# Patient Record
Sex: Female | Born: 1950
Health system: Southern US, Community
[De-identification: ages and names within clinical notes are randomized; demographics above are authoritative.]

## PROBLEM LIST (undated history)

## (undated) DIAGNOSIS — F329 Major depressive disorder, single episode, unspecified: Secondary | ICD-10-CM

## (undated) DIAGNOSIS — N200 Calculus of kidney: Secondary | ICD-10-CM

## (undated) DIAGNOSIS — G4730 Sleep apnea, unspecified: Secondary | ICD-10-CM

## (undated) DIAGNOSIS — I671 Cerebral aneurysm, nonruptured: Secondary | ICD-10-CM

## (undated) DIAGNOSIS — E7800 Pure hypercholesterolemia, unspecified: Secondary | ICD-10-CM

## (undated) DIAGNOSIS — I10 Essential (primary) hypertension: Secondary | ICD-10-CM

## (undated) DIAGNOSIS — F32A Depression, unspecified: Secondary | ICD-10-CM

## (undated) DIAGNOSIS — R42 Dizziness and giddiness: Secondary | ICD-10-CM

## (undated) DIAGNOSIS — K219 Gastro-esophageal reflux disease without esophagitis: Secondary | ICD-10-CM

## (undated) DIAGNOSIS — F419 Anxiety disorder, unspecified: Secondary | ICD-10-CM

## (undated) DIAGNOSIS — H269 Unspecified cataract: Secondary | ICD-10-CM

## (undated) DIAGNOSIS — C801 Malignant (primary) neoplasm, unspecified: Secondary | ICD-10-CM

## (undated) HISTORY — PX: COLONOSCOPY: SHX174

## (undated) HISTORY — DX: Anxiety disorder, unspecified: F41.9

## (undated) HISTORY — PX: ABDOMINAL HYSTERECTOMY: SHX81

## (undated) HISTORY — PX: TUBAL LIGATION: SHX77

## (undated) HISTORY — DX: Gastro-esophageal reflux disease without esophagitis: K21.9

## (undated) HISTORY — PX: EYE SURGERY: SHX253

---
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
## 2000-01-16 ENCOUNTER — Emergency Department (HOSPITAL_COMMUNITY): Admission: EM | Admit: 2000-01-16 | Discharge: 2000-01-16 | Payer: Self-pay | Admitting: *Deleted

## 2012-03-06 DIAGNOSIS — K219 Gastro-esophageal reflux disease without esophagitis: Secondary | ICD-10-CM | POA: Insufficient documentation

## 2012-09-04 DIAGNOSIS — K760 Fatty (change of) liver, not elsewhere classified: Secondary | ICD-10-CM | POA: Insufficient documentation

## 2013-05-21 DIAGNOSIS — G4730 Sleep apnea, unspecified: Secondary | ICD-10-CM

## 2013-05-21 HISTORY — DX: Sleep apnea, unspecified: G47.30

## 2013-07-20 DIAGNOSIS — M545 Low back pain, unspecified: Secondary | ICD-10-CM | POA: Insufficient documentation

## 2013-11-05 DIAGNOSIS — H3589 Other specified retinal disorders: Secondary | ICD-10-CM | POA: Insufficient documentation

## 2013-11-05 DIAGNOSIS — H35059 Retinal neovascularization, unspecified, unspecified eye: Secondary | ICD-10-CM | POA: Insufficient documentation

## 2013-11-05 DIAGNOSIS — H3560 Retinal hemorrhage, unspecified eye: Secondary | ICD-10-CM | POA: Insufficient documentation

## 2013-11-05 DIAGNOSIS — H4420 Degenerative myopia, unspecified eye: Secondary | ICD-10-CM | POA: Insufficient documentation

## 2013-11-05 DIAGNOSIS — H353190 Nonexudative age-related macular degeneration, unspecified eye, stage unspecified: Secondary | ICD-10-CM | POA: Insufficient documentation

## 2014-05-21 DIAGNOSIS — H269 Unspecified cataract: Secondary | ICD-10-CM

## 2014-05-21 HISTORY — DX: Unspecified cataract: H26.9

## 2014-09-30 DIAGNOSIS — L821 Other seborrheic keratosis: Secondary | ICD-10-CM | POA: Insufficient documentation

## 2014-11-09 DIAGNOSIS — M8580 Other specified disorders of bone density and structure, unspecified site: Secondary | ICD-10-CM | POA: Insufficient documentation

## 2014-11-09 DIAGNOSIS — N200 Calculus of kidney: Secondary | ICD-10-CM | POA: Insufficient documentation

## 2014-11-17 DIAGNOSIS — Z9849 Cataract extraction status, unspecified eye: Secondary | ICD-10-CM | POA: Insufficient documentation

## 2015-02-11 DIAGNOSIS — F419 Anxiety disorder, unspecified: Secondary | ICD-10-CM | POA: Insufficient documentation

## 2015-02-11 DIAGNOSIS — E559 Vitamin D deficiency, unspecified: Secondary | ICD-10-CM | POA: Insufficient documentation

## 2015-02-11 DIAGNOSIS — E782 Mixed hyperlipidemia: Secondary | ICD-10-CM | POA: Insufficient documentation

## 2015-08-06 DIAGNOSIS — R05 Cough: Secondary | ICD-10-CM | POA: Diagnosis not present

## 2015-08-14 DIAGNOSIS — J209 Acute bronchitis, unspecified: Secondary | ICD-10-CM | POA: Diagnosis not present

## 2015-08-14 DIAGNOSIS — J040 Acute laryngitis: Secondary | ICD-10-CM | POA: Diagnosis not present

## 2015-09-05 DIAGNOSIS — G4733 Obstructive sleep apnea (adult) (pediatric): Secondary | ICD-10-CM | POA: Diagnosis not present

## 2015-09-06 DIAGNOSIS — G4733 Obstructive sleep apnea (adult) (pediatric): Secondary | ICD-10-CM | POA: Diagnosis not present

## 2015-09-06 DIAGNOSIS — Z9989 Dependence on other enabling machines and devices: Secondary | ICD-10-CM | POA: Diagnosis not present

## 2015-09-06 DIAGNOSIS — R5383 Other fatigue: Secondary | ICD-10-CM | POA: Diagnosis not present

## 2015-10-14 DIAGNOSIS — R0789 Other chest pain: Secondary | ICD-10-CM | POA: Diagnosis not present

## 2015-12-28 DIAGNOSIS — K219 Gastro-esophageal reflux disease without esophagitis: Secondary | ICD-10-CM | POA: Diagnosis not present

## 2015-12-28 DIAGNOSIS — R0689 Other abnormalities of breathing: Secondary | ICD-10-CM | POA: Diagnosis not present

## 2015-12-28 DIAGNOSIS — F419 Anxiety disorder, unspecified: Secondary | ICD-10-CM | POA: Diagnosis not present

## 2016-01-19 DIAGNOSIS — I10 Essential (primary) hypertension: Secondary | ICD-10-CM | POA: Diagnosis not present

## 2016-02-24 DIAGNOSIS — L259 Unspecified contact dermatitis, unspecified cause: Secondary | ICD-10-CM | POA: Diagnosis not present

## 2016-06-30 DIAGNOSIS — R42 Dizziness and giddiness: Secondary | ICD-10-CM | POA: Diagnosis not present

## 2016-06-30 DIAGNOSIS — H6692 Otitis media, unspecified, left ear: Secondary | ICD-10-CM | POA: Diagnosis not present

## 2016-07-04 DIAGNOSIS — B029 Zoster without complications: Secondary | ICD-10-CM | POA: Diagnosis not present

## 2016-07-04 DIAGNOSIS — R1032 Left lower quadrant pain: Secondary | ICD-10-CM | POA: Diagnosis not present

## 2016-07-19 DIAGNOSIS — H43812 Vitreous degeneration, left eye: Secondary | ICD-10-CM | POA: Diagnosis not present

## 2016-07-19 DIAGNOSIS — H32 Chorioretinal disorders in diseases classified elsewhere: Secondary | ICD-10-CM | POA: Diagnosis not present

## 2016-07-19 DIAGNOSIS — B399 Histoplasmosis, unspecified: Secondary | ICD-10-CM | POA: Diagnosis not present

## 2016-07-20 DIAGNOSIS — Z1231 Encounter for screening mammogram for malignant neoplasm of breast: Secondary | ICD-10-CM | POA: Diagnosis not present

## 2016-08-13 ENCOUNTER — Emergency Department (HOSPITAL_COMMUNITY): Payer: PPO

## 2016-08-13 ENCOUNTER — Encounter (HOSPITAL_BASED_OUTPATIENT_CLINIC_OR_DEPARTMENT_OTHER): Payer: Self-pay | Admitting: Emergency Medicine

## 2016-08-13 ENCOUNTER — Emergency Department (HOSPITAL_BASED_OUTPATIENT_CLINIC_OR_DEPARTMENT_OTHER)
Admission: EM | Admit: 2016-08-13 | Discharge: 2016-08-13 | Disposition: A | Discharge status: Home / Self Care | Payer: PPO | Attending: Emergency Medicine | Admitting: Emergency Medicine

## 2016-08-13 DIAGNOSIS — R2681 Unsteadiness on feet: Secondary | ICD-10-CM

## 2016-08-13 DIAGNOSIS — I10 Essential (primary) hypertension: Secondary | ICD-10-CM | POA: Insufficient documentation

## 2016-08-13 DIAGNOSIS — Z79899 Other long term (current) drug therapy: Secondary | ICD-10-CM | POA: Diagnosis not present

## 2016-08-13 DIAGNOSIS — R2689 Other abnormalities of gait and mobility: Secondary | ICD-10-CM | POA: Insufficient documentation

## 2016-08-13 DIAGNOSIS — R42 Dizziness and giddiness: Secondary | ICD-10-CM | POA: Diagnosis not present

## 2016-08-13 DIAGNOSIS — R269 Unspecified abnormalities of gait and mobility: Secondary | ICD-10-CM | POA: Diagnosis not present

## 2016-08-13 HISTORY — DX: Dizziness and giddiness: R42

## 2016-08-13 HISTORY — DX: Essential (primary) hypertension: I10

## 2016-08-13 HISTORY — DX: Depression, unspecified: F32.A

## 2016-08-13 HISTORY — DX: Major depressive disorder, single episode, unspecified: F32.9

## 2016-08-13 LAB — URINALYSIS, ROUTINE W REFLEX MICROSCOPIC
Bilirubin Urine: NEGATIVE
Glucose, UA: NEGATIVE mg/dL
Hgb urine dipstick: NEGATIVE
Ketones, ur: NEGATIVE mg/dL
NITRITE: NEGATIVE
PH: 7 (ref 5.0–8.0)
Protein, ur: NEGATIVE mg/dL
SPECIFIC GRAVITY, URINE: 1.013 (ref 1.005–1.030)

## 2016-08-13 LAB — BASIC METABOLIC PANEL
Anion gap: 8 (ref 5–15)
BUN: 15 mg/dL (ref 6–20)
CHLORIDE: 105 mmol/L (ref 101–111)
CO2: 27 mmol/L (ref 22–32)
Calcium: 10.1 mg/dL (ref 8.9–10.3)
Creatinine, Ser: 0.65 mg/dL (ref 0.44–1.00)
GFR calc Af Amer: >60 mL/min (ref >60)
Glucose, Bld: 96 mg/dL (ref 65–99)
POTASSIUM: 3.5 mmol/L (ref 3.5–5.1)
Sodium: 140 mmol/L (ref 135–145)

## 2016-08-13 LAB — CBC
HEMATOCRIT: 44.3 % (ref 36.0–46.0)
HEMOGLOBIN: 14.8 g/dL (ref 12.0–15.0)
MCH: 28.8 pg (ref 26.0–34.0)
MCHC: 33.4 g/dL (ref 30.0–36.0)
MCV: 86.4 fL (ref 78.0–100.0)
Platelets: 303 10*3/uL (ref 150–400)
RBC: 5.13 MIL/uL — ABNORMAL HIGH (ref 3.87–5.11)
RDW: 13 % (ref 11.5–15.5)
WBC: 5.4 10*3/uL (ref 4.0–10.5)

## 2016-08-13 LAB — URINALYSIS, MICROSCOPIC (REFLEX)

## 2016-08-13 MED ORDER — MECLIZINE HCL 25 MG PO TABS
25.0000 mg | ORAL_TABLET | Freq: Once | ORAL | Status: AC
  Administered 2016-08-13: 25 mg via ORAL
  Filled 2016-08-13: qty 1

## 2016-08-13 MED ORDER — ONDANSETRON HCL 4 MG/2ML IJ SOLN
4.0000 mg | Freq: Once | INTRAMUSCULAR | Status: AC
  Administered 2016-08-13: 4 mg via INTRAVENOUS
  Filled 2016-08-13: qty 2

## 2016-08-13 MED ORDER — MECLIZINE HCL 25 MG PO TABS: 25.0000 mg | ORAL_TABLET | Freq: Three times a day (TID) | ORAL | 0 refills | Status: DC | PRN

## 2016-08-13 MED ORDER — SODIUM CHLORIDE 0.9 % IV BOLUS (SEPSIS)
1000.0000 mL | Freq: Once | INTRAVENOUS | Status: AC
  Administered 2016-08-13: 1000 mL via INTRAVENOUS

## 2016-08-13 NOTE — ED Triage Notes (Signed)
Dizziness since 6:00 last night. Pt has had vertigo in the past. Pt reports nausea, no vomiting. Denies weakness.

## 2016-08-13 NOTE — ED Notes (Signed)
Patient able to ambulate in hallways with steady gait. Upon standing patient noted momentary dizziness where she felt like she was spinning but notes it feels significantly improved from earlier today. Patient able to ambulate without assistance.

## 2016-08-13 NOTE — Progress Notes (Signed)
Pt went to the bathroom via wheelchair. BP when returned to bed was 209/81. Will recheck after she gets settled back in bed.

## 2016-08-13 NOTE — ED Notes (Signed)
No signature pad available pt states understanding discharge paperwork and follow up. Pt stable for discharge

## 2016-08-13 NOTE — ED Notes (Signed)
Patient transported to MRI 

## 2016-08-13 NOTE — Discharge Instructions (Signed)
Read the information below.  Use the prescribed medication as directed.  Please discuss all new medications with your pharmacist.  You may return to the Emergency Department at any time for worsening condition or any new symptoms that concern you.    °

## 2016-08-13 NOTE — ED Provider Notes (Signed)
Emergency Department Provider Note   I have reviewed the triage vital signs and the nursing notes.   HISTORY  Chief Complaint Dizziness   HPI Sherry Butler is a 66 y.o. female with PMH of vertigo, HTN, and depression resents to the emergency department for evaluation of severe vertigo symptoms. She reports onset of symptoms yesterday at 6 PM. They were relatively mild and mostly with movement. This morning she awoke with more constant symptoms. She states that she has a long history of vertigo in the past but his never had severe, constant symptoms. She typically reports worsening with turning to the right but with her episode today she has vertigo sensation with any movement of her head and some symptoms even with being completely still. She denies any weakness or numbness. She does have significant difficulty walking. She takes Lexapro which has helped some of her symptoms in the past. She reports having an MRI of her head many years ago but nothing recent. She has been compliant with her blood pressure and hyperlipidemia medications. No falls or recent head trauma. No vomiting but has had severe nausea.    Past Medical History:  Diagnosis Date  . Depression   . Hypertension   . Vertigo     There are no active problems to display for this patient.   Past Surgical History:  Procedure Laterality Date  . ABDOMINAL HYSTERECTOMY    . EYE SURGERY    . TUBAL LIGATION      Current Outpatient Rx  . Order #: [id number] Class: Historical Med  . Order #: [id number] Class: Historical Med  . Order #: [id number] Class: Historical Med  . Order #: [id number] Class: Historical Med  . Order #: [id number] Class: Historical Med  . Order #: [id number] Class: Print    Allergies Demerol [meperidine]; Lipitor [atorvastatin]; Propoxyphene; and Valium [diazepam]  No family history on file.  Social History Social History  Substance Use Topics  . Smoking status: Never Smoker  . Smokeless tobacco:  Never Used  . Alcohol use No    Review of Systems  Constitutional: No fever/chills Eyes: No visual changes. ENT: No sore throat. Cardiovascular: Denies chest pain. Respiratory: Denies shortness of breath. Gastrointestinal: No abdominal pain.  No nausea, no vomiting.  No diarrhea.  No constipation. Genitourinary: Negative for dysuria. Musculoskeletal: Negative for back pain. Skin: Negative for rash. Neurological: Negative for headaches, focal weakness or numbness. Positive vertigo and gait difficulty.   10-point ROS otherwise negative.  ____________________________________________   PHYSICAL EXAM:  VITAL SIGNS: ED Triage Vitals  Enc Vitals Group     BP 08/13/16 1033 (!) 180/92     Pulse Rate 08/13/16 1033 65     Resp 08/13/16 1033 18     Temp 08/13/16 1033 98.3 F (36.8 C)     Temp Source 08/13/16 1033 Oral     SpO2 08/13/16 1033 99 %     Weight 08/13/16 1033 174 lb (78.9 kg)     Height 08/13/16 1033 5\' 9"  (1.753 m)     Pain Score 08/13/16 1042 0   Constitutional: Alert and oriented. Well appearing and in no acute distress. Eyes: Conjunctivae are normal. PERRL. EOMI. Head: Atraumatic. Nose: No congestion/rhinnorhea. Mouth/Throat: Mucous membranes are moist.  Oropharynx non-erythematous. Neck: No stridor. Cardiovascular: Normal rate, regular rhythm. Good peripheral circulation. Grossly normal heart sounds.   Respiratory: Normal respiratory effort.  No retractions. Lungs CTAB. Gastrointestinal: Soft and nontender. No distention.  Musculoskeletal: No lower extremity tenderness nor edema. No gross deformities of extremities. Neurologic:  Normal speech and language. No motor or sensory deficit. Normal heel-to-shin. Mild ataxia on finger-to-nose testing to the LUE. Significant gait ataxia on ambulation.  Skin:  Skin is warm, dry and intact. No rash noted. Psychiatric: Mood and affect are normal. Speech and behavior are  normal.  ____________________________________________   LABS (all labs ordered are listed, but only abnormal results are displayed)  Labs Reviewed  CBC - Abnormal; Notable for the following:       Result Value   RBC 5.13 (*)    All other components within normal limits  URINALYSIS, ROUTINE W REFLEX MICROSCOPIC - Abnormal; Notable for the following:    APPearance CLOUDY (*)    Leukocytes, UA SMALL (*)    All other components within normal limits  URINALYSIS, MICROSCOPIC (REFLEX) - Abnormal; Notable for the following:    Bacteria, UA FEW (*)    Squamous Epithelial / LPF 0-5 (*)    All other components within normal limits  BASIC METABOLIC PANEL   ____________________________________________  EKG   EKG Interpretation  Date/Time:  Monday August 13 2016 11:03:28 EDT Ventricular Rate:  64 PR Interval:    QRS Duration: 99 QT Interval:  416 QTC Calculation: 430 R Axis:   -35 Text Interpretation:  Sinus rhythm Left axis deviation RSR' in V1 or V2, probably normal variant No STEMI. No old tracing for comparison.  Confirmed by LONG MD, JOSHUA [phone]) on 08/13/2016 11:05:39 AM       ____________________________________________  RADIOLOGY  Mr Brain Wo Contrast  Result Date: 08/13/2016 CLINICAL DATA:  Vertigo. Evaluate for posterior circulation infarct. EXAM: MRI HEAD WITHOUT CONTRAST TECHNIQUE: Multiplanar, multiecho pulse sequences of the brain and surrounding structures were obtained without intravenous contrast. COMPARISON:  None. FINDINGS: Brain: No acute infarction, hemorrhage, hydrocephalus, extra-axial collection or mass lesion. Overall mild FLAIR hyperintensity in the cerebral white matter attributed to chronic microvascular ischemia in this patient with history of hypertension. Mildly low cerebellar tonsils. No foramen magnum stenosis for Chiari malformation. Vascular: Preserved major flow voids Skull and upper cervical spine: Negative Sinuses/Orbits: Bilateral cataract  resection and staphyloma. No sinusitis or mastoiditis. Other: 1 cm rounded nodule in the right parotid, along the retromandibular vein. IMPRESSION: 1. No acute finding, including infarct. 2. Mild white matter disease, usually from chronic microvascular ischemia in this patient with history of hypertension. 3. 1 cm nodule in the right parotid which could be mildly enlarged lymph node or small neoplasm. Consider sonographic surveillance. Electronically Signed   By: Monte Fantasia M.D.   On: 08/13/2016 17:44    ____________________________________________   PROCEDURES  Procedure(s) performed:   Procedures  None ____________________________________________   INITIAL IMPRESSION / ASSESSMENT AND PLAN / ED COURSE  Pertinent labs & imaging results that were available during my care of the patient were reviewed by me and considered in my medical decision making (see chart for details).  Patient resents the emergency department for evaluation of vertigo symptoms. She is having near constant symptoms with even mild movement of her head. She's had many years of vertigo but no symptoms quite like this before. She has some mild dysmetria of her left upper extremity on finger-nose testing. Her gait is significantly abnormal. Could be acute exacerbation of peripheral vertigo but my suspicion for central vertigo was somewhat elevated especially in the setting of the patient's age and several risk factors for stroke. To treat symptoms and obtain baseline labs. Will reassess and decide on transfer for MRI after reassessment.   12:52 PM Patient with  continued vertigo symptoms. Worse with motion but some at rest. Continues to have gait instability. This may be acute exacerbation of her prior vertigo but it seems atypical even for her. Given that she is not responding to therapy and her exam I plan for transfer to [facility] for MRI. I discussed that since we're concern for stroke I advised that she go by  ambulance. The patient and her husband at bedside would much rather go by private vehicle. The husband will be driving. Patient with no other acute neurological findings. I will allow them to go by private vehicle. I discussed the risks of doing so in detail. Patient's IV will be secured for transport. I spoke with Dr. Billy Fischer who accepted the patient in transfer.  ____________________________________________  FINAL CLINICAL IMPRESSION(S) / ED DIAGNOSES  Final diagnoses:  Vertigo  Gait instability     MEDICATIONS GIVEN DURING THIS VISIT:  Medications  sodium chloride 0.9 % bolus 1,000 mL (0 mLs Intravenous Stopped 08/13/16 1255)  meclizine (ANTIVERT) tablet 25 mg (25 mg Oral Given 08/13/16 1122)  ondansetron (ZOFRAN) injection 4 mg (4 mg Intravenous Given 08/13/16 1121)  meclizine (ANTIVERT) tablet 25 mg (25 mg Oral Given 08/13/16 1621)     NEW OUTPATIENT MEDICATIONS STARTED DURING THIS VISIT:  Discharge Medication List as of 08/13/2016  6:19 PM    START taking these medications   Details  meclizine (ANTIVERT) 25 MG tablet Take 1 tablet (25 mg total) by mouth 3 (three) times daily as needed for dizziness., Starting Mon 08/13/2016, Print        Note:  This document was prepared using Dragon voice recognition software and may include unintentional dictation errors.  Nanda Quinton, MD Emergency Medicine   Margette Fast, MD 08/14/16 [phone]

## 2016-08-13 NOTE — ED Triage Notes (Signed)
Pt sent here from [facility] for an MRI.  States acute onset dizziness after bending over last night.  States hx of vertigo.  NIH 0.

## 2016-08-13 NOTE — ED Provider Notes (Signed)
Patient sent from [facility] for MRI.  Pt with vertiginous symptoms that began last night.  Has hx vertigo but symptoms more severe than usual, not relieved with her normal methods.  No relief with treatment at [facility] ED.  She does note she is somewhat improved currently, as long as she does not move.   Plan is for MRI.    She is not claustrophobic.  Unable to take valium because it has caused suicidal ideation previously.  Will not give anything related.  Will redose antivert as it has been 5 hours since last dose.  Physical Exam  BP (!) 169/87 (BP Location: Right Arm)   Pulse (!) 59   Temp 98.6 F (37 C) (Oral)   Resp 18   Ht 5\' 9"  (1.753 m)   Wt 78.9 kg   SpO2 98%   BMI 25.70 kg/m   Physical Exam   CN II-XII intact, EOMs intact, no pronator drift, grip strengths equal bilaterally; strength 5/5 in all extremities, sensation intact in all extremities; finger to nose, heel to shin, rapid alternating movements normal.     ED Course  Procedures  MDM Pt with uncontrolled vertigo sent from [facility] for MRI brain.  MRI is negative.  Pt feeling improvement.   Exam improved per patient and chart.  Discussed MRI results including parotid nodule and advised follow up.  D/C home with antivert, PCP, ENT referral.  Discussed result, findings, treatment, and follow up  with patient.  Pt given return precautions.  Pt verbalizes understanding and agrees with plan.          Clayton Bibles, PA-C 08/13/16 [place], MD 08/14/16 [phone]

## 2016-08-13 NOTE — ED Notes (Signed)
IV secured with gauze for POV transport.

## 2016-08-23 ENCOUNTER — Ambulatory Visit (INDEPENDENT_AMBULATORY_CARE_PROVIDER_SITE_OTHER): Payer: PPO | Admitting: Family Medicine

## 2016-08-23 VITALS — BP 138/82 | HR 63 | Temp 97.9°F | Ht 70.0 in | Wt 182.2 lb

## 2016-08-23 DIAGNOSIS — F320 Major depressive disorder, single episode, mild: Secondary | ICD-10-CM | POA: Insufficient documentation

## 2016-08-23 DIAGNOSIS — G4733 Obstructive sleep apnea (adult) (pediatric): Secondary | ICD-10-CM | POA: Diagnosis not present

## 2016-08-23 DIAGNOSIS — I10 Essential (primary) hypertension: Secondary | ICD-10-CM | POA: Insufficient documentation

## 2016-08-23 DIAGNOSIS — F32A Depression, unspecified: Secondary | ICD-10-CM

## 2016-08-23 DIAGNOSIS — Z9989 Dependence on other enabling machines and devices: Secondary | ICD-10-CM | POA: Diagnosis not present

## 2016-08-23 DIAGNOSIS — R42 Dizziness and giddiness: Secondary | ICD-10-CM | POA: Diagnosis not present

## 2016-08-23 NOTE — Patient Instructions (Addendum)
Please come and see me in a few months for a physical and labs- please fast for 6-8 hours prior to your visit so we can check a cholesterol panel for you

## 2016-08-23 NOTE — Progress Notes (Signed)
[place] at [facility] [address] [place], [place] [address] [phone]  Date:  08/23/2016   Name:  Sherry Butler   DOB:  11/27/1950   MRN:  [id number]  PCP:  Lamar Blinks, MD    Chief Complaint: Establish Care (Pt here to est care. Pt was seen in the ER for vertigo. Pt states that vertigo comes and goes. Using meclizine prn which helps just a little. )   History of Present Illness:  Sherry Butler is a 66 y.o. 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 She works at [employer]  She had some trouble with her eyes and was out of the work force for a few years- but is happy to be back at work, likes to have something to do and to make extra money. She also enjoys the exercise she gets walking around the store She has 4 children 47, 45, 41, 40 She has 17 grandchildren! Her children all live up [place].  She is originally from [place].  She met her husband in [place]- they then moved [facility].  After her divorce she moved here to be closer to her sister, and met her current husband (they have been married for 6 years).    She was in the ER about a week ago with vertigo- she awoke with symptoms of dizziness. Her sx were quite severe- she was not able to stand on her own or walk. Due to the severity of her sx they went to the ER. They did an MRI of her head as below:  IMPRESSION: 1. No acute finding, including infarct. 2. Mild white matter disease, usually from chronic microvascular ischemia in this patient with  history of hypertension. 3. 1 cm nodule in the right parotid which could be mildly enlarged lymph node or small neoplasm. Consider sonographic surveillance.  She is seeing an ENT doctor on 4/17- to discuss her MRI finding of a parotid nodule  She has had episodic vertigo for several years.   Right now her vertigo is calmed down- she does epley maneuvers at home as needed   She takes lexapro for depression- she feels like she would ike to stop using this but when she has tried to stop using it she has sx of withdrawal like a "wind whoshing through my ears."  She has tried to taper off it slowly but has not been able to do so She takes it every other day currently which works for her- she does feel like it helps to take the edge off her nervousness   She had a mammogram 3 weeks ago per her report - normal Pap done about a year ago Colonoscopy- done 8 years ago, she was told to follow-up in 10 years.    She does not take vaccines generally as she is really afraid of shots   She is a never smoker  Her daughter recently needed a hysterectomy for uterine fibroids and uterine cancer.  She was treated surgically and  did not require chemo, etc Patient Active Problem List   Diagnosis Date Noted  . OSA on CPAP 08/23/2016  . Essential hypertension 08/23/2016  . Mild depression ([place]) 08/23/2016  . Vertigo 08/23/2016    Past Medical History:  Diagnosis Date  . Depression   . Hypertension   . Vertigo     Past Surgical History:  Procedure Laterality Date  . ABDOMINAL HYSTERECTOMY    . EYE SURGERY    . TUBAL LIGATION      Social History  Substance Use Topics  . Smoking status: Never Smoker  . Smokeless tobacco: Never Used  . Alcohol use No    No family history on file.  Allergies  Allergen Reactions  . Propoxyphene Nausea And Vomiting    Propoxyphene and Acetaminophen  . Demerol [Meperidine]   . Lipitor [Atorvastatin]   . Valium [Diazepam]     Medication list has been  reviewed and updated.  Current Outpatient Prescriptions on File Prior to Visit  Medication Sig Dispense Refill  . cholecalciferol (VITAMIN D) 1000 units tablet Take 1,000 Units by mouth daily.    . COD LIVER OIL PO Take 1 capsule by mouth daily.    Marland Kitchen escitalopram (LEXAPRO) 10 MG tablet Take 10 mg by mouth every other day.     . lisinopril (PRINIVIL,ZESTRIL) 10 MG tablet Take 10 mg by mouth daily.    . meclizine (ANTIVERT) 25 MG tablet Take 1 tablet (25 mg total) by mouth 3 (three) times daily as needed for dizziness. 30 tablet 0  . thiamine (VITAMIN B-1) 100 MG tablet Take 100 mg by mouth daily.     No current facility-administered medications on file prior to visit.     Review of Systems:  As per HPI- otherwise negative.  No fever, chills, CP, SOB, nausea, vomiting, diarrhea    Physical Examination: Vitals:   08/23/16 1517  BP: 138/82  Pulse: 63  Temp: 97.9 F (36.6 C)   Vitals:   08/23/16 1517  Weight: 182 lb 3.2 oz (82.6 kg)  Height: '5\' 10"'$  (1.778 m)   Body mass index is 26.14 kg/m. Ideal Body Weight: Weight in (lb) to have BMI = 25: 173.9  GEN: WDWN, NAD, Non-toxic, A & O x 3, slight overweight, looks well HEENT: Atraumatic, Normocephalic. Neck supple. No masses, No LAD. Ears and Nose: No external deformity. CV: RRR, No M/G/R. No JVD. No thrill. No extra heart sounds. PULM: CTA B, no wheezes, crackles, rhonchi. No retractions. No resp. distress. No accessory muscle use. ABD: S, NT, ND. No rebound. No HSM. EXTR: No c/c/e NEURO Normal gait.  PSYCH: Normally interactive. Conversant. Not depressed or anxious appearing.  Calm demeanor.    Assessment and Plan: Essential hypertension  OSA on CPAP  Mild depression (HCC)  Vertigo  Here today to establish care Discussed her health maint and her medications She will see me for a CPE and labs soon If any RF needed in the meantime she will alert me   Signed Lamar Blinks, MD

## 2016-09-04 DIAGNOSIS — H903 Sensorineural hearing loss, bilateral: Secondary | ICD-10-CM | POA: Diagnosis not present

## 2016-09-04 DIAGNOSIS — K119 Disease of salivary gland, unspecified: Secondary | ICD-10-CM | POA: Diagnosis not present

## 2016-09-04 DIAGNOSIS — R42 Dizziness and giddiness: Secondary | ICD-10-CM | POA: Diagnosis not present

## 2016-09-21 ENCOUNTER — Telehealth: Payer: Self-pay | Admitting: Family Medicine

## 2016-09-21 ENCOUNTER — Other Ambulatory Visit: Payer: Self-pay | Admitting: Emergency Medicine

## 2016-09-21 MED ORDER — ESCITALOPRAM OXALATE 10 MG PO TABS: 10.0000 mg | ORAL_TABLET | ORAL | 1 refills | Status: DC

## 2016-09-21 MED ORDER — LISINOPRIL 10 MG PO TABS: 10.0000 mg | ORAL_TABLET | Freq: Every day | ORAL | 1 refills | Status: DC

## 2016-09-21 NOTE — Telephone Encounter (Signed)
Self.   Refill for lisinopril, escitalopram   Pharmacy: [facility]. [address], [place]

## 2016-09-21 NOTE — Telephone Encounter (Signed)
Refills have been sent.  

## 2016-09-21 NOTE — Telephone Encounter (Signed)
°  Relation to FP:KGYB Call back number:[phone] Pharmacy: [facility], [place] [address]   Reason for call:  Patient calling back checking on the status of blood pressure medication, patient states she contacted pharmacy on Monday and request was sent by pharmacy, patient completely out, please advise

## 2016-09-21 NOTE — Telephone Encounter (Signed)
Patient informed. 

## 2016-10-10 DIAGNOSIS — H903 Sensorineural hearing loss, bilateral: Secondary | ICD-10-CM | POA: Diagnosis not present

## 2016-10-10 DIAGNOSIS — K119 Disease of salivary gland, unspecified: Secondary | ICD-10-CM | POA: Diagnosis not present

## 2016-10-10 DIAGNOSIS — R42 Dizziness and giddiness: Secondary | ICD-10-CM | POA: Diagnosis not present

## 2016-10-12 ENCOUNTER — Other Ambulatory Visit: Payer: Self-pay | Admitting: Otolaryngology

## 2016-10-12 DIAGNOSIS — K118 Other diseases of salivary glands: Secondary | ICD-10-CM

## 2016-10-18 ENCOUNTER — Other Ambulatory Visit: Payer: PPO

## 2016-10-23 NOTE — Progress Notes (Addendum)
[place] [facility] [address] [place], [place] [address] [phone]  Date:  10/24/2016   Name:  Sherry Butler   DOB:  1950/08/24   MRN:  [id number]  PCP:  Darreld Mclean, MD    Chief Complaint: Follow-up (Pt here for 6-8 week up. Due for PAP and fasting labs. )   History of Present Illness:  Sherry Butler is a 66 y.o. very pleasant female patient who presents with the following:  Seen here to establish care about 2 months ago- HPI as follows  She has suffered from kidney stones and had surgery to remove these about a year ago She has also had a partial hysterectomy years ago for fibroids- benign disease She is treated for HTN with lisinopril and also OSA with CPAP History of GERD that will occasionally bother her at night  Here today for a recheck/ CPE Would like to have a pap today She is fasting for labs  Last pap: done last year, normal She did have a hysterectomy approx 15 years ago for benign disease; no cancer history.  She still has her ovaries.  She is not sure about her cervix  She did have a BMP and CBC in March- no recent fasting lipid panel however Needs hep C screening Bone density: most recent screening 5-6 years ago, it was normal.  Will schedule for her  Colon cancer screening:8 years ago, looked ok, told to come back in 10 years.   Tetanus/ pneumonia shots: last tetanus shot more than 10 years ago, never had a pneumonia shot  Declines these services today  She is generally feeling well except for pain in her left hip for a couple of weeks She is working a lot of hours and is on her feet a lot, which we think may be contributing to her increased MSK pains NKI She is using a lot of ben-gay, not taking any other medications She will have occasional muscle spasms and pain, but not really nerve radiation She will have pain if she has been sitting for a time and then gets up to walk  She feels  like her lexapro is working well for her- she uses this every other day  She does have a history of herpes 2- we think that she got this from her new husband who she married 8 years ago.  He was married for a long time, but prior to their getting together he had "a wild time" during which period we think he got HSV.     Sherry Butler does have periodic HSV outbreaks on her buttocks- however she does not want to start valtrex   Patient Active Problem List   Diagnosis Date Noted  . OSA on CPAP 08/23/2016  . Essential hypertension 08/23/2016  . Mild depression ([place]) 08/23/2016  . Vertigo 08/23/2016    Past Medical History:  Diagnosis Date  . Depression   . Hypertension   . Vertigo     Past Surgical History:  Procedure Laterality Date  . ABDOMINAL HYSTERECTOMY    . EYE SURGERY    . TUBAL LIGATION      Social History  Substance Use Topics  . Smoking status: Never Smoker  . Smokeless tobacco: Never Used  . Alcohol use No    No family history on file.  Allergies  Allergen Reactions  . Propoxyphene Nausea And Vomiting    Propoxyphene and Acetaminophen  . Demerol [Meperidine]   .  Lipitor [Atorvastatin]   . Valium [Diazepam]     Medication list has been reviewed and updated.  Current Outpatient Prescriptions on File Prior to Visit  Medication Sig Dispense Refill  . cholecalciferol (VITAMIN D) 1000 units tablet Take 1,000 Units by mouth daily.    . COD LIVER OIL PO Take 1 capsule by mouth daily.    Marland Kitchen escitalopram (LEXAPRO) 10 MG tablet Take 1 tablet (10 mg total) by mouth every other day. 90 tablet 1  . lisinopril (PRINIVIL,ZESTRIL) 10 MG tablet Take 1 tablet (10 mg total) by mouth daily. 90 tablet 1  . meclizine (ANTIVERT) 25 MG tablet Take 1 tablet (25 mg total) by mouth 3 (three) times daily as needed for dizziness. 30 tablet 0  . thiamine (VITAMIN B-1) 100 MG tablet Take 100 mg by mouth daily.     No current facility-administered medications on file prior to visit.      Review of Systems:  As per HPI- otherwise negative.   Physical Examination: Vitals:   10/24/16 0902  BP: 140/82  Pulse: (!) 59  Temp: 98 F (36.7 C)   Vitals:   10/24/16 0902  Weight: 178 lb 3.2 oz (80.8 kg)  Height: 5\' 10"  (1.778 m)   Body mass index is 25.57 kg/m. Ideal Body Weight: Weight in (lb) to have BMI = 25: 173.9  GEN: WDWN, NAD, Non-toxic, A & O x 3, looks well HEENT: Atraumatic, Normocephalic. Neck supple. No masses, No LAD.  Bilateral TM wnl, oropharynx normal.  PEERL,EOMI.   Ears and Nose: No external deformity. CV: RRR, No M/G/R. No JVD. No thrill. No extra heart sounds. PULM: CTA B, no wheezes, crackles, rhonchi. No retractions. No resp. distress. No accessory muscle use. ABD: S, NT, ND, +BS. No rebound. No HSM. EXTR: No c/c/e NEURO Normal gait.  PSYCH: Normally interactive. Conversant. Not depressed or anxious appearing.  Calm demeanor.  Breast: normal exam, no masses/ dimpling/ discharge Pelvic: normal, no vaginal lesions or discharge. Uterus and cervix absent, no adnexal tendereness or masses She notes tenderness over her left SI joint Normal BLE strength and sensation Normal lumbar ROM   Assessment and Plan: Physical exam  Encounter for hepatitis C screening test for low risk patient - Plan: Hepatitis C antibody  Screening for hyperlipidemia - Plan: Lipid panel  Essential hypertension  Medication monitoring encounter - Plan: Comprehensive metabolic panel  Screening for diabetes mellitus - Plan: Hemoglobin A1c  Screening for vaginal cancer - Plan: Cytology - PAP  Estrogen deficiency - Plan: DG Bone Density  Chronic left SI joint pain - Plan: meloxicam (MOBIC) 7.5 MG tablet, cyclobenzaprine (FLEXERIL) 10 MG tablet  HSV-2 (herpes simplex virus 2) infection  Here today for a CPE Pap today- explained to pt that she does not need to continue to have this testing assuming her pap is normal as she is s/p hysterectomy for benign disease   Will have her try mobic and flexeril at night for her hip pain- she will let me know if not helpful Bone density and pap, labs ordered today  Signed Lamar Blinks, MD  Received labs 6/10-  Results for orders placed or performed in visit on 10/24/16  Comprehensive metabolic panel  Result Value Ref Range   Sodium 139 135 - 145 mEq/L   Potassium 3.8 3.5 - 5.1 mEq/L   Chloride 106 96 - 112 mEq/L   CO2 26 19 - 32 mEq/L   Glucose, Bld 90 70 - 99 mg/dL   BUN 19 6 -  23 mg/dL   Creatinine, Ser 0.67 0.40 - 1.20 mg/dL   Total Bilirubin 0.8 0.2 - 1.2 mg/dL   Alkaline Phosphatase 84 39 - 117 U/L   AST 20 0 - 37 U/L   ALT 14 0 - 35 U/L   Total Protein 7.5 6.0 - 8.3 g/dL   Albumin 4.5 3.5 - 5.2 g/dL   Calcium 9.7 8.4 - 10.5 mg/dL   GFR 93.54 >60.00 mL/min  Lipid panel  Result Value Ref Range   Cholesterol 203 (H) 0 - 200 mg/dL   Triglycerides 156.0 (H) 0.0 - 149.0 mg/dL   HDL 37.80 (L) >39.00 mg/dL   VLDL 31.2 0.0 - 40.0 mg/dL   LDL Cholesterol 134 (H) 0 - 99 mg/dL   Total CHOL/HDL Ratio 5    NonHDL 165.55   Hemoglobin A1c  Result Value Ref Range   Hgb A1c MFr Bld 5.6 4.6 - 6.5 %  Hepatitis C antibody  Result Value Ref Range   HCV Ab NEGATIVE NEGATIVE  Cytology - PAP  Result Value Ref Range   Adequacy Satisfactory for evaluation.    Diagnosis      NEGATIVE FOR INTRAEPITHELIAL LESIONS OR MALIGNANCY.   HPV 16/18/45 genotyping NEGATIVE for HPV 16 & 18/45    HPV DETECTED (A)    Material Submitted Vaginal Pap [ThinPrep Imaged]    CYTOLOGY - PAP PAP RESULT    Calculated her CV disease risk- 18%.  Would recommend a cholesterol med.  She also needs a repeat pap and co-test in one year.  Will need to call pt  Called pt 6/10- she would like to start a cholesterol med, will rx for her. She has lipitor listed as an allergy- per pt this is in error, she had some anxiety when on lipitor due to other factors, never had any rash, hives, swelling, etc.  Will remove this allergy from her  list.  She understands that she will need repeat pap in one year.  Letter to pt

## 2016-10-24 ENCOUNTER — Other Ambulatory Visit (HOSPITAL_COMMUNITY)
Admission: RE | Admit: 2016-10-24 | Discharge: 2016-10-24 | Disposition: A | Discharge status: Home / Self Care | Payer: PPO | Source: Ambulatory Visit | Attending: Family Medicine | Admitting: Family Medicine

## 2016-10-24 ENCOUNTER — Ambulatory Visit (INDEPENDENT_AMBULATORY_CARE_PROVIDER_SITE_OTHER): Payer: PPO | Admitting: Family Medicine

## 2016-10-24 VITALS — BP 140/82 | HR 59 | Temp 98.0°F | Ht 70.0 in | Wt 178.2 lb

## 2016-10-24 DIAGNOSIS — Z13220 Encounter for screening for lipoid disorders: Secondary | ICD-10-CM | POA: Diagnosis not present

## 2016-10-24 DIAGNOSIS — Z0001 Encounter for general adult medical examination with abnormal findings: Secondary | ICD-10-CM | POA: Diagnosis not present

## 2016-10-24 DIAGNOSIS — Z0000 Encounter for general adult medical examination without abnormal findings: Secondary | ICD-10-CM | POA: Insufficient documentation

## 2016-10-24 DIAGNOSIS — Z1159 Encounter for screening for other viral diseases: Secondary | ICD-10-CM | POA: Diagnosis not present

## 2016-10-24 DIAGNOSIS — F329 Major depressive disorder, single episode, unspecified: Secondary | ICD-10-CM | POA: Insufficient documentation

## 2016-10-24 DIAGNOSIS — E2839 Other primary ovarian failure: Secondary | ICD-10-CM | POA: Diagnosis not present

## 2016-10-24 DIAGNOSIS — B009 Herpesviral infection, unspecified: Secondary | ICD-10-CM | POA: Diagnosis not present

## 2016-10-24 DIAGNOSIS — E785 Hyperlipidemia, unspecified: Secondary | ICD-10-CM

## 2016-10-24 DIAGNOSIS — Z1272 Encounter for screening for malignant neoplasm of vagina: Secondary | ICD-10-CM | POA: Insufficient documentation

## 2016-10-24 DIAGNOSIS — Z5181 Encounter for therapeutic drug level monitoring: Secondary | ICD-10-CM

## 2016-10-24 DIAGNOSIS — M533 Sacrococcygeal disorders, not elsewhere classified: Secondary | ICD-10-CM | POA: Diagnosis not present

## 2016-10-24 DIAGNOSIS — Z131 Encounter for screening for diabetes mellitus: Secondary | ICD-10-CM

## 2016-10-24 DIAGNOSIS — G8929 Other chronic pain: Secondary | ICD-10-CM

## 2016-10-24 DIAGNOSIS — I10 Essential (primary) hypertension: Secondary | ICD-10-CM | POA: Insufficient documentation

## 2016-10-24 DIAGNOSIS — G4733 Obstructive sleep apnea (adult) (pediatric): Secondary | ICD-10-CM | POA: Insufficient documentation

## 2016-10-24 LAB — COMPREHENSIVE METABOLIC PANEL
ALT: 14 U/L (ref 0–35)
AST: 20 U/L (ref 0–37)
Albumin: 4.5 g/dL (ref 3.5–5.2)
Alkaline Phosphatase: 84 U/L (ref 39–117)
BILIRUBIN TOTAL: 0.8 mg/dL (ref 0.2–1.2)
BUN: 19 mg/dL (ref 6–23)
CO2: 26 meq/L (ref 19–32)
Calcium: 9.7 mg/dL (ref 8.4–10.5)
Chloride: 106 mEq/L (ref 96–112)
Creatinine, Ser: 0.67 mg/dL (ref 0.40–1.20)
GFR: 93.54 mL/min (ref >60.00)
GLUCOSE: 90 mg/dL (ref 70–99)
Potassium: 3.8 mEq/L (ref 3.5–5.1)
SODIUM: 139 meq/L (ref 135–145)
Total Protein: 7.5 g/dL (ref 6.0–8.3)

## 2016-10-24 LAB — HEMOGLOBIN A1C: Hgb A1c MFr Bld: 5.6 % (ref 4.6–6.5)

## 2016-10-24 LAB — LIPID PANEL
CHOL/HDL RATIO: 5
Cholesterol: 203 mg/dL — ABNORMAL HIGH (ref 0–200)
HDL: 37.8 mg/dL — AB (ref >39.00)
LDL Cholesterol: 134 mg/dL — ABNORMAL HIGH (ref 0–99)
NonHDL: 165.55
Triglycerides: 156 mg/dL — ABNORMAL HIGH (ref 0.0–149.0)
VLDL: 31.2 mg/dL (ref 0.0–40.0)

## 2016-10-24 LAB — HEPATITIS C ANTIBODY: HCV Ab: NEGATIVE

## 2016-10-24 MED ORDER — CYCLOBENZAPRINE HCL 10 MG PO TABS: 10.0000 mg | ORAL_TABLET | Freq: Every day | ORAL | 0 refills | Status: DC

## 2016-10-24 MED ORDER — MELOXICAM 7.5 MG PO TABS: 7.5000 mg | ORAL_TABLET | Freq: Every day | ORAL | 0 refills | Status: DC

## 2016-10-24 NOTE — Patient Instructions (Addendum)
It was very nice to see you today- I will be in touch with your blood work and pap asap Since you have had a hysterectomy and are over age 66, you no longer have to continue to get pap screening unless you want to  I would recommend that your get a tetanus and pneumonia booster at your convenience   We will treat you for left SI joint pain with mobic (for inflammation- take once a day in the morning) and the flexeril at bedtime as needed (muscle relaxer- will make you feel drowsy!)    Please let me know if your hip is not feeling better in the next 1-2 weeks- Sooner if worse.     Health Maintenance for Postmenopausal Women Menopause is a normal process in which your reproductive ability comes to an end. This process happens gradually over a span of months to years, usually between the ages of 66 and 70. Menopause is complete when you have missed 12 consecutive menstrual periods. It is important to talk with your health care provider about some of the most common conditions that affect postmenopausal women, such as heart disease, cancer, and bone loss (osteoporosis). Adopting a healthy lifestyle and getting preventive care can help to promote your health and wellness. Those actions can also lower your chances of developing some of these common conditions. What should I know about menopause? During menopause, you may experience a number of symptoms, such as:  Moderate-to-severe hot flashes.  Night sweats.  Decrease in sex drive.  Mood swings.  Headaches.  Tiredness.  Irritability.  Memory problems.  Insomnia.  Choosing to treat or not to treat menopausal changes is an individual decision that you make with your health care provider. What should I know about hormone replacement therapy and supplements? Hormone therapy products are effective for treating symptoms that are associated with menopause, such as hot flashes and night sweats. Hormone replacement carries certain risks,  especially as you become older. If you are thinking about using estrogen or estrogen with progestin treatments, discuss the benefits and risks with your health care provider. What should I know about heart disease and stroke? Heart disease, heart attack, and stroke become more likely as you age. This may be due, in part, to the hormonal changes that your body experiences during menopause. These can affect how your body processes dietary fats, triglycerides, and cholesterol. Heart attack and stroke are both medical emergencies. There are many things that you can do to help prevent heart disease and stroke:  Have your blood pressure checked at least every 1-2 years. High blood pressure causes heart disease and increases the risk of stroke.  If you are 7-51 years old, ask your health care provider if you should take aspirin to prevent a heart attack or a stroke.  Do not use any tobacco products, including cigarettes, chewing tobacco, or electronic cigarettes. If you need help quitting, ask your health care provider.  It is important to eat a healthy diet and maintain a healthy weight. ? Be sure to include plenty of vegetables, fruits, low-fat dairy products, and lean protein. ? Avoid eating foods that are high in solid fats, added sugars, or salt (sodium).  Get regular exercise. This is one of the most important things that you can do for your health. ? Try to exercise for at least 150 minutes each week. The type of exercise that you do should increase your heart rate and make you sweat. This is known as moderate-intensity exercise. ?  Try to do strengthening exercises at least twice each week. Do these in addition to the moderate-intensity exercise.  Know your numbers.Ask your health care provider to check your cholesterol and your blood glucose. Continue to have your blood tested as directed by your health care provider.  What should I know about cancer screening? There are several types of  cancer. Take the following steps to reduce your risk and to catch any cancer development as early as possible. Breast Cancer  Practice breast self-awareness. ? This means understanding how your breasts normally appear and feel. ? It also means doing regular breast self-exams. Let your health care provider know about any changes, no matter how small.  If you are 16 or older, have a clinician do a breast exam (clinical breast exam or CBE) every year. Depending on your age, family history, and medical history, it may be recommended that you also have a yearly breast X-ray (mammogram).  If you have a family history of breast cancer, talk with your health care provider about genetic screening.  If you are at high risk for breast cancer, talk with your health care provider about having an MRI and a mammogram every year.  Breast cancer (BRCA) gene test is recommended for women who have family members with BRCA-related cancers. Results of the assessment will determine the need for genetic counseling and BRCA1 and for BRCA2 testing. BRCA-related cancers include these types: ? Breast. This occurs in males or females. ? Ovarian. ? Tubal. This may also be called fallopian tube cancer. ? Cancer of the abdominal or pelvic lining (peritoneal cancer). ? Prostate. ? Pancreatic.  Cervical, Uterine, and Ovarian Cancer Your health care provider may recommend that you be screened regularly for cancer of the pelvic organs. These include your ovaries, uterus, and vagina. This screening involves a pelvic exam, which includes checking for microscopic changes to the surface of your cervix (Pap test).  For women ages 21-65, health care providers may recommend a pelvic exam and a Pap test every three years. For women ages 35-65, they may recommend the Pap test and pelvic exam, combined with testing for human papilloma virus (HPV), every five years. Some types of HPV increase your risk of cervical cancer. Testing for HPV  may also be done on women of any age who have unclear Pap test results.  Other health care providers may not recommend any screening for nonpregnant women who are considered low risk for pelvic cancer and have no symptoms. Ask your health care provider if a screening pelvic exam is right for you.  If you have had past treatment for cervical cancer or a condition that could lead to cancer, you need Pap tests and screening for cancer for at least 20 years after your treatment. If Pap tests have been discontinued for you, your risk factors (such as having a new sexual partner) need to be reassessed to determine if you should start having screenings again. Some women have medical problems that increase the chance of getting cervical cancer. In these cases, your health care provider may recommend that you have screening and Pap tests more often.  If you have a family history of uterine cancer or ovarian cancer, talk with your health care provider about genetic screening.  If you have vaginal bleeding after reaching menopause, tell your health care provider.  There are currently no reliable tests available to screen for ovarian cancer.  Lung Cancer Lung cancer screening is recommended for adults 43-97 years old who are  at high risk for lung cancer because of a history of smoking. A yearly low-dose CT scan of the lungs is recommended if you:  Currently smoke.  Have a history of at least 30 pack-years of smoking and you currently smoke or have quit within the past 15 years. A pack-year is smoking an average of one pack of cigarettes per day for one year.  Yearly screening should:  Continue until it has been 15 years since you quit.  Stop if you develop a health problem that would prevent you from having lung cancer treatment.  Colorectal Cancer  This type of cancer can be detected and can often be prevented.  Routine colorectal cancer screening usually begins at age 65 and continues through age  45.  If you have risk factors for colon cancer, your health care provider may recommend that you be screened at an earlier age.  If you have a family history of colorectal cancer, talk with your health care provider about genetic screening.  Your health care provider may also recommend using home test kits to check for hidden blood in your stool.  A small camera at the end of a tube can be used to examine your colon directly (sigmoidoscopy or colonoscopy). This is done to check for the earliest forms of colorectal cancer.  Direct examination of the colon should be repeated every 5-10 years until age 27. However, if early forms of precancerous polyps or small growths are found or if you have a family history or genetic risk for colorectal cancer, you may need to be screened more often.  Skin Cancer  Check your skin from head to toe regularly.  Monitor any moles. Be sure to tell your health care provider: ? About any new moles or changes in moles, especially if there is a change in a mole's shape or color. ? If you have a mole that is larger than the size of a pencil eraser.  If any of your family members has a history of skin cancer, especially at a young age, talk with your health care provider about genetic screening.  Always use sunscreen. Apply sunscreen liberally and repeatedly throughout the day.  Whenever you are outside, protect yourself by wearing long sleeves, pants, a wide-brimmed hat, and sunglasses.  What should I know about osteoporosis? Osteoporosis is a condition in which bone destruction happens more quickly than new bone creation. After menopause, you may be at an increased risk for osteoporosis. To help prevent osteoporosis or the bone fractures that can happen because of osteoporosis, the following is recommended:  If you are 40-84 years old, get at least 1,000 mg of calcium and at least 600 mg of vitamin D per day.  If you are older than age 39 but younger than age  77, get at least 1,200 mg of calcium and at least 600 mg of vitamin D per day.  If you are older than age 72, get at least 1,200 mg of calcium and at least 800 mg of vitamin D per day.  Smoking and excessive alcohol intake increase the risk of osteoporosis. Eat foods that are rich in calcium and vitamin D, and do weight-bearing exercises several times each week as directed by your health care provider. What should I know about how menopause affects my mental health? Depression may occur at any age, but it is more common as you become older. Common symptoms of depression include:  Low or sad mood.  Changes in sleep patterns.  Changes  in appetite or eating patterns.  Feeling an overall lack of motivation or enjoyment of activities that you previously enjoyed.  Frequent crying spells.  Talk with your health care provider if you think that you are experiencing depression. What should I know about immunizations? It is important that you get and maintain your immunizations. These include:  Tetanus, diphtheria, and pertussis (Tdap) booster vaccine.  Influenza every year before the flu season begins.  Pneumonia vaccine.  Shingles vaccine.  Your health care provider may also recommend other immunizations. This information is not intended to replace advice given to you by your health care provider. Make sure you discuss any questions you have with your health care provider. Document Released: 06/29/2005 Document Revised: 11/25/2015 Document Reviewed: 02/08/2015 Elsevier Interactive Patient Education  2018 [employer].

## 2016-10-25 ENCOUNTER — Ambulatory Visit
Admission: RE | Admit: 2016-10-25 | Discharge: 2016-10-25 | Disposition: A | Discharge status: Home / Self Care | Payer: PPO | Source: Ambulatory Visit | Attending: Otolaryngology | Admitting: Otolaryngology

## 2016-10-25 DIAGNOSIS — K118 Other diseases of salivary glands: Secondary | ICD-10-CM

## 2016-10-25 LAB — CYTOLOGY - PAP
Diagnosis: NEGATIVE
HPV 16/18/45 genotyping: NEGATIVE
HPV: DETECTED — AB

## 2016-10-25 MED ORDER — IOPAMIDOL (ISOVUE-300) INJECTION 61%
75.0000 mL | Freq: Once | INTRAVENOUS | Status: AC | PRN
  Administered 2016-10-25: 75 mL via INTRAVENOUS

## 2016-10-28 NOTE — Addendum Note (Signed)
Addended by: Lamar Blinks on: 10/28/2016 03:59 PM   Modules accepted: Orders

## 2016-10-29 MED ORDER — LOVASTATIN 20 MG PO TABS: 20.0000 mg | ORAL_TABLET | Freq: Every day | ORAL | 3 refills | Status: DC

## 2016-10-29 NOTE — Addendum Note (Signed)
Addended by: Lamar Blinks C on: 10/29/2016 08:36 AM   Modules accepted: Orders

## 2016-11-12 ENCOUNTER — Encounter: Payer: Self-pay | Admitting: Family Medicine

## 2016-11-12 ENCOUNTER — Ambulatory Visit (HOSPITAL_BASED_OUTPATIENT_CLINIC_OR_DEPARTMENT_OTHER)
Admission: RE | Admit: 2016-11-12 | Discharge: 2016-11-12 | Disposition: A | Discharge status: Home / Self Care | Payer: PPO | Source: Ambulatory Visit | Attending: Family Medicine | Admitting: Family Medicine

## 2016-11-12 DIAGNOSIS — M8588 Other specified disorders of bone density and structure, other site: Secondary | ICD-10-CM | POA: Insufficient documentation

## 2016-11-12 DIAGNOSIS — E2839 Other primary ovarian failure: Secondary | ICD-10-CM | POA: Diagnosis not present

## 2016-11-29 DIAGNOSIS — H32 Chorioretinal disorders in diseases classified elsewhere: Secondary | ICD-10-CM | POA: Diagnosis not present

## 2016-11-29 DIAGNOSIS — B399 Histoplasmosis, unspecified: Secondary | ICD-10-CM | POA: Diagnosis not present

## 2017-01-25 ENCOUNTER — Ambulatory Visit (INDEPENDENT_AMBULATORY_CARE_PROVIDER_SITE_OTHER): Payer: PPO | Admitting: Medical

## 2017-01-25 ENCOUNTER — Encounter: Payer: Self-pay | Admitting: Medical

## 2017-01-25 ENCOUNTER — Telehealth: Payer: Self-pay | Admitting: Family Medicine

## 2017-01-25 VITALS — BP 160/90 | HR 61 | Temp 98.0°F | Resp 16 | Ht 70.0 in | Wt 175.8 lb

## 2017-01-25 DIAGNOSIS — R1013 Epigastric pain: Secondary | ICD-10-CM | POA: Diagnosis not present

## 2017-01-25 DIAGNOSIS — I10 Essential (primary) hypertension: Secondary | ICD-10-CM

## 2017-01-25 DIAGNOSIS — R252 Cramp and spasm: Secondary | ICD-10-CM

## 2017-01-25 DIAGNOSIS — R1031 Right lower quadrant pain: Secondary | ICD-10-CM

## 2017-01-25 LAB — CBC WITH DIFFERENTIAL/PLATELET
BASOS ABS: 0 10*3/uL (ref 0.0–0.1)
BASOS PCT: 0.5 % (ref 0.0–3.0)
Eosinophils Absolute: 0.1 10*3/uL (ref 0.0–0.7)
Eosinophils Relative: 1.4 % (ref 0.0–5.0)
HEMATOCRIT: 41 % (ref 36.0–46.0)
Hemoglobin: 13.6 g/dL (ref 12.0–15.0)
LYMPHS PCT: 33.7 % (ref 12.0–46.0)
Lymphs Abs: 2 10*3/uL (ref 0.7–4.0)
MCHC: 33.3 g/dL (ref 30.0–36.0)
MCV: 89 fl (ref 78.0–100.0)
MONOS PCT: 6.2 % (ref 3.0–12.0)
Monocytes Absolute: 0.4 10*3/uL (ref 0.1–1.0)
NEUTROS ABS: 3.4 10*3/uL (ref 1.4–7.7)
Neutrophils Relative %: 58.2 % (ref 43.0–77.0)
PLATELETS: 270 10*3/uL (ref 150.0–400.0)
RBC: 4.6 Mil/uL (ref 3.87–5.11)
RDW: 12.6 % (ref 11.5–15.5)
WBC: 5.8 10*3/uL (ref 4.0–10.5)

## 2017-01-25 LAB — LIPASE: LIPASE: 54 U/L (ref 11.0–59.0)

## 2017-01-25 LAB — COMPREHENSIVE METABOLIC PANEL
ALK PHOS: 79 U/L (ref 39–117)
ALT: 19 U/L (ref 0–35)
AST: 23 U/L (ref 0–37)
Albumin: 4.4 g/dL (ref 3.5–5.2)
BILIRUBIN TOTAL: 0.6 mg/dL (ref 0.2–1.2)
BUN: 19 mg/dL (ref 6–23)
CALCIUM: 9.6 mg/dL (ref 8.4–10.5)
CO2: 28 meq/L (ref 19–32)
Chloride: 105 mEq/L (ref 96–112)
Creatinine, Ser: 0.71 mg/dL (ref 0.40–1.20)
GFR: 87.42 mL/min (ref >60.00)
Glucose, Bld: 95 mg/dL (ref 70–99)
Potassium: 3.8 mEq/L (ref 3.5–5.1)
Sodium: 140 mEq/L (ref 135–145)
Total Protein: 7.4 g/dL (ref 6.0–8.3)

## 2017-01-25 LAB — POC URINALSYSI DIPSTICK (AUTOMATED)
Bilirubin, UA: NEGATIVE
Glucose, UA: NEGATIVE
KETONES UA: NEGATIVE
LEUKOCYTES UA: NEGATIVE
Nitrite, UA: NEGATIVE
PH UA: 7 (ref 5.0–8.0)
PROTEIN UA: NEGATIVE
RBC UA: NEGATIVE
SPEC GRAV UA: 1.01 (ref 1.010–1.025)
Urobilinogen, UA: 0.2 E.U./dL

## 2017-01-25 LAB — AMYLASE: Amylase: 37 U/L (ref 27–131)

## 2017-01-25 NOTE — Telephone Encounter (Signed)
[facility] - Client TELEPHONE ADVICE RECORD [facility] Patient Name: Sherry Butler DOB: Jul 18, 1950 Initial Comment Caller states she has been having leg cramps at night. 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 The office wanted pt to speak to a nurse. Caller wanted to have her Potassium checked. Nurse Assessment Nurse: Ardine Bjork, RN, Melissa Date/Time (Eastern Time): 01/25/2017 10:06:09 AM Confirm and document reason for call. If symptomatic, describe symptoms. ---Caller states she has been having leg cramps at night-both legs cramping at different times. 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 The office wanted pt to speak to a nurse. Caller wanted to have her Potassium checked. Sxs started 3-4 weeks ago-does not occur every night but is getting more frequent. Has had potassium problems in past -states this is due to BP med. Does the patient have any new or worsening symptoms? ---Yes Will a triage be completed? ---Yes Related visit to physician within the last 2 weeks? ---No Does the PT have any chronic conditions? (i.e. diabetes, asthma, etc.) ---Yes List chronic conditions. ---HTN. Is this a behavioral health or substance abuse call? ---No Guidelines Guideline Title Affirmed Question Affirmed Notes Leg Pain Numbness in a leg or foot (i.e., loss of sensation) Final Disposition User See Physician within 24 Hours Zayas, RN, Melissa Comments Appt made with PA Mackie Pai art 1:15pm today at her office in [facility]. Referrals REFERRED TO PCP OFFICE Disagree/Comply: Comply

## 2017-01-25 NOTE — Patient Instructions (Addendum)
I am ordering labs to evaluate your cramping. With labs will see [place] electrolytes to see if cause found. Generally recommend trying to eat a banana a day to avoid cramps related to low potassium. But will confirm if potassium low.  For your blood pressure, I think that recent high level of stress at work and whitecoat hypertension may be the cause of elevated BP today. However I want you to confirm the history with blood pressure checks at your home. If your blood pressure does not decrease to lower than 140/90 then I want you to take 2 tablets of your current lisinopril a day. If any neurologic signs or symptoms occur with high blood pressure then advise emergency department evaluation.  For your abdomen pain, I want you to get Zantac over-the-counter and use daily. However due to the severe abdomen pain on palpation/exam today it is necessary to do labs to workup potential cause and I feel it is necessary to do CT of your abdomen/pelvis.  I will let you know the results of the studies when they are in. Please keep your phone charge and your volume up.  In the event your abdomen pain worsens over the weekend would recommend emergency department evaluation.  Follow-up in 7 days or as needed.  Note referral staff did attempt to call patient's insurance and sat on phone for about 30 minutes attempting to get someone on the line to get the prior authorization. I believe she eventually did submit form but never talked to someone in person. Staff member/when told me that it might take up to 48 hours to get official prior authorization. This was explained to the patient. So I advised the patient to watch yourself closely and if she has worsening or changing symptoms then go to emergency department where she could have the CT of the abdomen done on emergent basis. It is worth noting that patient was not even aware of the level of pain that she was having until actually did exam/palpated her abdomen. Patient  expressed understanding regarding advisement.

## 2017-01-25 NOTE — Progress Notes (Signed)
Subjective:    Patient ID: Sherry Butler, female    DOB: 06/14/1950, 66 y.o.   MRN: [id number]  HPI   Pt in for some cramping in her legs. She states recently working some 11 hour days. Walks all day long. Up to 10,000 steps a day. Pt is not on hctz. In past rare occasional calf cramp. Pt states calf intermittent cramping at night. Pt does eat some bananas but not daily. Last few days not eating any bananas.  Pt bp elevated but had to fire someone today and employee created seen. Also supervisors were there. No cardiac or neurologic signs or symptoms. Bp when she checks 120-130/70. Pt states told white coat htn in past.  Pt states some abdomen pain. Started after her morning coffee. Pain sharp comes and goes. Pt has some gerd but in past diet controlled. Or takes rollaids. But in past mild heart burn that occurs intermittently. No fever, no chills, no sweats or diarrhea. No nausea or vomiting. Pt has gallbladder and appendix.    Review of Systems  Constitutional: Negative for chills, fatigue and fever.  Respiratory: Negative for cough, chest tightness, shortness of breath and wheezing.   Cardiovascular: Negative for chest pain and palpitations.  Gastrointestinal: Positive for abdominal pain. Negative for abdominal distention, anal bleeding, blood in stool, constipation, diarrhea, nausea, rectal pain and vomiting.  Musculoskeletal: Negative for back pain.       Leg cramps at night.  Skin: Negative for rash.  Neurological: Negative for dizziness, seizures, syncope, weakness, numbness and headaches.  Hematological: Negative for adenopathy. Does not bruise/bleed easily.  Psychiatric/Behavioral: Negative for behavioral problems and confusion.    Past Medical History:  Diagnosis Date  . Depression   . Hypertension   . Vertigo      Social History   Social History  . Marital status: Married    Spouse name: N/A  . Number of children: N/A  . Years of education: N/A   Occupational  History  . Not on file.   Social History Main Topics  . Smoking status: Never Smoker  . Smokeless tobacco: Never Used  . Alcohol use No  . Drug use: Unknown  . Sexual activity: Not on file   Other Topics Concern  . Not on file   Social History Narrative  . No narrative on file    Past Surgical History:  Procedure Laterality Date  . ABDOMINAL HYSTERECTOMY    . EYE SURGERY    . TUBAL LIGATION      No family history on file.  Allergies  Allergen Reactions  . Propoxyphene Nausea And Vomiting    Propoxyphene and Acetaminophen  . Demerol [Meperidine]   . Valium [Diazepam]     Current Outpatient Prescriptions on File Prior to Visit  Medication Sig Dispense Refill  . cholecalciferol (VITAMIN D) 1000 units tablet Take 1,000 Units by mouth daily.    . COD LIVER OIL PO Take 1 capsule by mouth daily.    . cyclobenzaprine (FLEXERIL) 10 MG tablet Take 1 tablet (10 mg total) by mouth at bedtime. Use as needed for hip pain 30 tablet 0  . escitalopram (LEXAPRO) 10 MG tablet Take 1 tablet (10 mg total) by mouth every other day. 90 tablet 1  . lisinopril (PRINIVIL,ZESTRIL) 10 MG tablet Take 1 tablet (10 mg total) by mouth daily. 90 tablet 1  . lovastatin (MEVACOR) 20 MG tablet Take 1 tablet (20 mg total) by mouth at bedtime. 90 tablet 3  . meclizine (  ANTIVERT) 25 MG tablet Take 1 tablet (25 mg total) by mouth 3 (three) times daily as needed for dizziness. 30 tablet 0  . meloxicam (MOBIC) 7.5 MG tablet Take 1 tablet (7.5 mg total) by mouth daily. 30 tablet 0  . thiamine (VITAMIN B-1) 100 MG tablet Take 100 mg by mouth daily.     No current facility-administered medications on file prior to visit.     BP (!) 178/100   Pulse 61   Temp 98 F (36.7 C) (Oral)   Resp 16   Ht 5\' 10"  (1.778 m)   Wt 175 lb 12.8 oz (79.7 kg)   SpO2 100%   BMI 25.22 kg/m       Objective:   Physical Exam  General Mental Status- Alert. General Appearance- Not in acute distress. Last seen in a very  good mood though she does admit to high-level stress today.   Skin General: Color- Normal Color. Moisture- Normal Moisture.  Neck Carotid Arteries- Normal color. Moisture- Normal Moisture. No carotid bruits. No JVD.  Chest and Lung Exam Auscultation: Breath Sounds:-Normal.  Cardiovascular Auscultation:Rythm- Regular. Murmurs & Other Heart Sounds:Auscultation of the heart reveals- No Murmurs.  Abdomen Inspection:-Inspeection Normal. Palpation/Percussion:Note:No mass. Palpation and Percussion of the abdomen reveal-  Tender tenderness 8-9/10 level pain on palpation of the right lower quadrant and  Lt sideepigastric region, Non Distended + BS, no rebound or guarding.  No heel jar pain on right side. No pain on rotation of right lower extremity.    Neurologic Cranial Nerve exam:- CN III-XII intact(No nystagmus), symmetric smile. Strength:- 5/5 equal and symmetric strength both upper and lower extremities.  Lower ext- no pedal edema, no calf swelling and negative homans signs.      Assessment & Plan:  I am ordering labs to evaluate your cramping. These labs will see [place] electrolytes to see if cause found. Generally recommend trying to eat a banana a day to avoid cramps related to low potassium.  For your blood pressure, I think that recent high level of stress at work and whitecoat hypertension may be the cause of elevated BP today. However I want you to confirm this  with blood pressure checks at your home. If your blood pressure does not decrease to lower than 140/90 then I want you to take 2 tablets of your current lisinopril a day. If any neurologic signs or symptoms occur with high blood pressure then advise emergency department evaluation.  For your abdomen pain, I want you to get Zantac over-the-counter and use daily. However due to the severe abdomen pain on palpation/exam today it is necessary to do labs to workup potential cause and I feel it is necessary to do CT of your  abdomen/pelvis.  I will let you know the results of the studies when they are in. Please keep your phone charge and your volume up.  In the event your abdomen pain worsens over the weekend would recommend emergency department evaluation.  Follow-up in 7 days or as needed.  Note referral staff did attempt to call patient's insurance and sat on phone for about 30 minutes attempting to get someone on the line to get the prior authorization. I believe she eventually did submit form but never talked to someone in person. Staff member/when told me that it might take up to 48 hours to get official prior authorization. This was explained to the patient. So I advised the patient to watch yourself closely and if she has worsening or changing symptoms  then go to emergency department where she could have the CT of the abdomen done on emergent basis. It is worth noting that patient was not even aware of the level of pain that she was having until actually did exam/palpated her abdomen. Patient expressed understanding regarding advisement.  Breydon Senters, Percell Miller, PA-C

## 2017-01-28 ENCOUNTER — Encounter (HOSPITAL_BASED_OUTPATIENT_CLINIC_OR_DEPARTMENT_OTHER): Payer: Self-pay

## 2017-01-28 ENCOUNTER — Ambulatory Visit (HOSPITAL_BASED_OUTPATIENT_CLINIC_OR_DEPARTMENT_OTHER)
Admission: RE | Admit: 2017-01-28 | Discharge: 2017-01-28 | Disposition: A | Discharge status: Home / Self Care | Payer: PPO | Source: Ambulatory Visit | Attending: Medical | Admitting: Medical

## 2017-01-28 ENCOUNTER — Telehealth: Payer: Self-pay | Admitting: Medical

## 2017-01-28 ENCOUNTER — Ambulatory Visit: Payer: PPO | Admitting: Family Medicine

## 2017-01-28 DIAGNOSIS — I700 Atherosclerosis of aorta: Secondary | ICD-10-CM | POA: Diagnosis not present

## 2017-01-28 DIAGNOSIS — J9811 Atelectasis: Secondary | ICD-10-CM | POA: Insufficient documentation

## 2017-01-28 DIAGNOSIS — R1031 Right lower quadrant pain: Secondary | ICD-10-CM

## 2017-01-28 DIAGNOSIS — R350 Frequency of micturition: Secondary | ICD-10-CM

## 2017-01-28 LAB — H. PYLORI BREATH TEST: H. PYLORI BREATH TEST: NOT DETECTED

## 2017-01-28 LAB — MAGNESIUM: Magnesium: 2.2 mg/dL (ref 1.5–2.5)

## 2017-01-28 MED ORDER — IOPAMIDOL (ISOVUE-300) INJECTION 61%
100.0000 mL | Freq: Once | INTRAVENOUS | Status: AC | PRN
Start: 2017-01-28 — End: 2017-01-28
  Administered 2017-01-28: 100 mL via INTRAVENOUS

## 2017-01-28 NOTE — Telephone Encounter (Signed)
Future urine poct and culture placed. Diagnosis was urinary frequency. Patient will come in tomorrow to get sample.

## 2017-01-29 ENCOUNTER — Other Ambulatory Visit (INDEPENDENT_AMBULATORY_CARE_PROVIDER_SITE_OTHER): Payer: PPO

## 2017-01-29 DIAGNOSIS — R350 Frequency of micturition: Secondary | ICD-10-CM

## 2017-01-29 LAB — POC URINALSYSI DIPSTICK (AUTOMATED)
BILIRUBIN UA: NEGATIVE
GLUCOSE UA: NEGATIVE
Ketones, UA: NEGATIVE
Nitrite, UA: NEGATIVE
Protein, UA: NEGATIVE
RBC UA: NEGATIVE
SPEC GRAV UA: 1.015 (ref 1.010–1.025)
UROBILINOGEN UA: 0.2 U/dL
pH, UA: 7 (ref 5.0–8.0)

## 2017-01-30 LAB — URINE CULTURE
MICRO NUMBER:: 80999733
SPECIMEN QUALITY:: ADEQUATE

## 2017-01-31 ENCOUNTER — Telehealth: Payer: Self-pay | Admitting: Family Medicine

## 2017-01-31 ENCOUNTER — Telehealth: Payer: Self-pay | Admitting: Medical

## 2017-01-31 NOTE — Telephone Encounter (Signed)
°  Relation to VT:VNRW Call back Cobb:  Reason for call:  Patient experiencing abdominal pain due to kidney stone, patient in need of clinical advice, please advise

## 2017-01-31 NOTE — Telephone Encounter (Signed)
The urine poct that was done yesterday was not resulted. You remember the results? If it was done and yet the results please have them resulted.

## 2017-01-31 NOTE — Telephone Encounter (Signed)
Disregard the question about results in her urine. This was actually on a different patient. I will send you that message on the correct patient's chart.

## 2017-01-31 NOTE — Telephone Encounter (Signed)
Called patient advised. See result note.

## 2017-02-01 ENCOUNTER — Ambulatory Visit (INDEPENDENT_AMBULATORY_CARE_PROVIDER_SITE_OTHER): Payer: PPO | Admitting: Medical

## 2017-02-01 ENCOUNTER — Encounter: Payer: Self-pay | Admitting: Medical

## 2017-02-01 VITALS — BP 140/71 | HR 57 | Temp 97.8°F | Ht 70.0 in | Wt 174.4 lb

## 2017-02-01 DIAGNOSIS — R6883 Chills (without fever): Secondary | ICD-10-CM

## 2017-02-01 DIAGNOSIS — N200 Calculus of kidney: Secondary | ICD-10-CM | POA: Diagnosis not present

## 2017-02-01 DIAGNOSIS — M549 Dorsalgia, unspecified: Secondary | ICD-10-CM

## 2017-02-01 DIAGNOSIS — R39859 Costovertebral (angle) tenderness, unspecified side: Secondary | ICD-10-CM

## 2017-02-01 LAB — CBC WITH DIFFERENTIAL/PLATELET
Basophils Absolute: 29 cells/uL (ref 0–200)
Basophils Relative: 0.5 %
EOS PCT: 2.3 %
Eosinophils Absolute: 131 cells/uL (ref 15–500)
HEMATOCRIT: 40.2 % (ref 35.0–45.0)
HEMOGLOBIN: 13.5 g/dL (ref 11.7–15.5)
LYMPHS ABS: 2103 {cells}/uL (ref 850–3900)
MCH: 29.1 pg (ref 27.0–33.0)
MCHC: 33.6 g/dL (ref 32.0–36.0)
MCV: 86.6 fL (ref 80.0–100.0)
MPV: 11 fL (ref 7.5–12.5)
Monocytes Relative: 6.3 %
NEUTROS ABS: 3078 {cells}/uL (ref 1500–7800)
Neutrophils Relative %: 54 %
Platelets: 275 10*3/uL (ref 140–400)
RBC: 4.64 10*6/uL (ref 3.80–5.10)
RDW: 12.5 % (ref 11.0–15.0)
Total Lymphocyte: 36.9 %
WBC mixed population: 359 cells/uL (ref 200–950)
WBC: 5.7 10*3/uL (ref 3.8–10.8)

## 2017-02-01 LAB — POC URINALSYSI DIPSTICK (AUTOMATED)
Bilirubin, UA: NEGATIVE
Blood, UA: NEGATIVE
GLUCOSE UA: NEGATIVE
Ketones, UA: NEGATIVE
LEUKOCYTES UA: NEGATIVE
NITRITE UA: NEGATIVE
Protein, UA: NEGATIVE
Spec Grav, UA: 1.02 (ref 1.010–1.025)
UROBILINOGEN UA: 0.2 U/dL
pH, UA: 6 (ref 5.0–8.0)

## 2017-02-01 MED ORDER — CIPROFLOXACIN HCL 500 MG PO TABS: 500.0000 mg | ORAL_TABLET | Freq: Two times a day (BID) | ORAL | 0 refills | Status: DC

## 2017-02-01 MED ORDER — HYDROCODONE-ACETAMINOPHEN 5-325 MG PO TABS: 1.0000 | ORAL_TABLET | Freq: Four times a day (QID) | ORAL | 0 refills | Status: DC | PRN

## 2017-02-01 NOTE — Progress Notes (Signed)
Subjective:    Patient ID: Sherry Butler, female    DOB: 18-Sep-1950, 66 y.o.   MRN: [id number]  HPI  Pt in stating she had onset of severe back pain rt side cva area that started early afternoon. Pain was 10/10. Then 3-4 hours later pain decreased to 6/10. Pt on last visit had work up that showed rt side kidney stone in lower pole of kidney. Pt has history of kidney stones in past on on left side.   Pt pain level since last night did decrease since last night. She took alleve and drank a little beer. Patient states pain level is 2-3/10 now.   Some chills last night.    Review of Systems  Constitutional: Positive for chills. Negative for fatigue and fever.       Some chills last night.  Respiratory: Negative for cough, chest tightness, shortness of breath and wheezing.   Cardiovascular: Negative for chest pain and palpitations.  Gastrointestinal: Negative for abdominal pain.  Genitourinary: Negative for dysuria, flank pain, frequency, hematuria and urgency.  Musculoskeletal: Positive for back pain. Negative for arthralgias, gait problem, neck pain and neck stiffness.       Rt cva tenderness.  Skin: Negative for rash.  Hematological: Negative for adenopathy. Does not bruise/bleed easily.  Psychiatric/Behavioral: Negative for behavioral problems, confusion and suicidal ideas. The patient is not nervous/anxious.     Past Medical History:  Diagnosis Date  . Depression   . Hypertension   . Vertigo      Social History   Social History  . Marital status: Married    Spouse name: N/A  . Number of children: N/A  . Years of education: N/A   Occupational History  . Not on file.   Social History Main Topics  . Smoking status: Never Smoker  . Smokeless tobacco: Never Used  . Alcohol use No  . Drug use: Unknown  . Sexual activity: Not on file   Other Topics Concern  . Not on file   Social History Narrative  . No narrative on file    Past Surgical History:  Procedure  Laterality Date  . ABDOMINAL HYSTERECTOMY    . EYE SURGERY    . TUBAL LIGATION      No family history on file.  Allergies  Allergen Reactions  . Propoxyphene Nausea And Vomiting    Propoxyphene and Acetaminophen  . Demerol [Meperidine]   . Valium [Diazepam]     Current Outpatient Prescriptions on File Prior to Visit  Medication Sig Dispense Refill  . cholecalciferol (VITAMIN D) 1000 units tablet Take 1,000 Units by mouth daily.    . COD LIVER OIL PO Take 1 capsule by mouth daily.    Marland Kitchen escitalopram (LEXAPRO) 10 MG tablet Take 1 tablet (10 mg total) by mouth every other day. 90 tablet 1  . lisinopril (PRINIVIL,ZESTRIL) 10 MG tablet Take 1 tablet (10 mg total) by mouth daily. 90 tablet 1  . lovastatin (MEVACOR) 20 MG tablet Take 1 tablet (20 mg total) by mouth at bedtime. 90 tablet 3  . meclizine (ANTIVERT) 25 MG tablet Take 1 tablet (25 mg total) by mouth 3 (three) times daily as needed for dizziness. 30 tablet 0  . thiamine (VITAMIN B-1) 100 MG tablet Take 100 mg by mouth daily.    . cyclobenzaprine (FLEXERIL) 10 MG tablet Take 1 tablet (10 mg total) by mouth at bedtime. Use as needed for hip pain (Patient not taking: Reported on 02/01/2017) 30 tablet 0  .  meloxicam (MOBIC) 7.5 MG tablet Take 1 tablet (7.5 mg total) by mouth daily. (Patient not taking: Reported on 02/01/2017) 30 tablet 0   No current facility-administered medications on file prior to visit.     BP 140/71   Pulse (!) 57   Temp 97.8 F (36.6 C) (Oral)   Ht 5\' 10"  (1.778 m)   Wt 174 lb 6.4 oz (79.1 kg)   SpO2 98%   BMI 25.02 kg/m       Objective:   Physical Exam  General Appearance- Not in acute distress.  HEENT Eyes- Scleraeral/Conjuntiva-bilat- Not Yellow. Mouth & Throat- Normal.  Chest and Lung Exam Auscultation: Breath sounds:-Normal. Adventitious sounds:- No Adventitious sounds.  Cardiovascular Auscultation:Rythm - Regular. Heart Sounds -Normal heart sounds.  Abdomen Inspection:-Inspection  Normal.  Palpation/Perucssion: Palpation and Percussion of the abdomen reveal- faint Tender, flank area.(no pain ruq or rlq), No Rebound tenderness, No rigidity(Guarding) and No Palpable abdominal masses.  Liver:-Normal.  Spleen:- Normal.   Back- faint rt cva tender.      Assessment & Plan:  Your location of back pain and level of pain recently is very suspicious for a kidney stone that may have moved from the right lower portion of the kidney into the ureter.  Your pain was severe yesterday and better now. However I have concern over the weekend that the pain may return. For low level pain you can use ibuprofen or Aleve over-the-counter. If the pain becomes severe as she described yesterday then use Norco. Caution with this as you have had side effects with different pain medications in the past. If any severe side effects from [place] or if your pain is not tolerable then recommend emergency department evaluation as they could give her an injection of Toradol. Also might repeat the CT which I don't think is necessary presently.   For your chills recently will send urine out for culture. Pending the culture will provide 3 day prescription of Cipro. Also will get a CBC.   Prescription for urine strainer given. If you catch a stone let us know and we could send it out for analysis.   Follow up on Tuesday or as needed.   Saprina Chuong, Percell Miller, PA-C

## 2017-02-01 NOTE — Progress Notes (Signed)
Pre visit review using our clinic tool,if applicable. No additional management support is needed unless otherwise documented below in the visit note.  

## 2017-02-01 NOTE — Patient Instructions (Addendum)
Your location of back pain and level of pain recently is very suspicious for a kidney stone that may have moved from the right lower portion of the kidney into the ureter.  Your pain was severe yesterday and better now. However I have concern over the weekend that the pain may return. For low level pain you can use ibuprofen or Aleve over-the-counter. If the pain becomes severe as she described yesterday then use Norco. Caution with this as you have had side effects with different pain medications in the past. If any severe side effects from [place] or if your pain is not tolerable then recommend emergency department evaluation as they could give her an injection of Toradol. Also might repeat the CT which I don't think is necessary presently.   For your chills recently will send urine out for culture. Pending the culture will provide 3 day prescription of Cipro. Also will get a CBC.   Prescription for urine strainer given. If you catch a stone let us know and we could send it out for analysis.   Follow up on Tuesday or as needed.

## 2017-02-02 LAB — URINE CULTURE
MICRO NUMBER:: 81017316
Result:: NO GROWTH
SPECIMEN QUALITY:: ADEQUATE

## 2017-02-05 ENCOUNTER — Telehealth: Payer: Self-pay | Admitting: Medical

## 2017-02-05 ENCOUNTER — Encounter: Payer: Self-pay | Admitting: Medical

## 2017-02-05 ENCOUNTER — Ambulatory Visit (INDEPENDENT_AMBULATORY_CARE_PROVIDER_SITE_OTHER): Payer: PPO | Admitting: Medical

## 2017-02-05 ENCOUNTER — Ambulatory Visit (HOSPITAL_BASED_OUTPATIENT_CLINIC_OR_DEPARTMENT_OTHER)
Admission: RE | Admit: 2017-02-05 | Discharge: 2017-02-05 | Disposition: A | Discharge status: Home / Self Care | Payer: PPO | Source: Ambulatory Visit | Attending: Medical | Admitting: Medical

## 2017-02-05 VITALS — BP 133/80 | HR 55 | Temp 97.7°F | Resp 16 | Ht 70.0 in | Wt 174.6 lb

## 2017-02-05 DIAGNOSIS — N23 Unspecified renal colic: Secondary | ICD-10-CM

## 2017-02-05 DIAGNOSIS — I700 Atherosclerosis of aorta: Secondary | ICD-10-CM | POA: Insufficient documentation

## 2017-02-05 DIAGNOSIS — R110 Nausea: Secondary | ICD-10-CM | POA: Diagnosis not present

## 2017-02-05 DIAGNOSIS — M549 Dorsalgia, unspecified: Secondary | ICD-10-CM

## 2017-02-05 DIAGNOSIS — R911 Solitary pulmonary nodule: Secondary | ICD-10-CM | POA: Diagnosis not present

## 2017-02-05 DIAGNOSIS — N200 Calculus of kidney: Secondary | ICD-10-CM | POA: Insufficient documentation

## 2017-02-05 DIAGNOSIS — R109 Unspecified abdominal pain: Secondary | ICD-10-CM

## 2017-02-05 DIAGNOSIS — R61 Generalized hyperhidrosis: Secondary | ICD-10-CM | POA: Diagnosis not present

## 2017-02-05 LAB — COMPREHENSIVE METABOLIC PANEL
ALK PHOS: 81 U/L (ref 39–117)
ALT: 17 U/L (ref 0–35)
AST: 21 U/L (ref 0–37)
Albumin: 4.4 g/dL (ref 3.5–5.2)
BILIRUBIN TOTAL: 0.7 mg/dL (ref 0.2–1.2)
BUN: 19 mg/dL (ref 6–23)
CO2: 30 mEq/L (ref 19–32)
CREATININE: 0.65 mg/dL (ref 0.40–1.20)
Calcium: 9.8 mg/dL (ref 8.4–10.5)
Chloride: 107 mEq/L (ref 96–112)
GFR: 96.79 mL/min (ref >60.00)
Glucose, Bld: 99 mg/dL (ref 70–99)
Potassium: 4.6 mEq/L (ref 3.5–5.1)
Sodium: 143 mEq/L (ref 135–145)
TOTAL PROTEIN: 7.5 g/dL (ref 6.0–8.3)

## 2017-02-05 LAB — POC URINALSYSI DIPSTICK (AUTOMATED)
Bilirubin, UA: NEGATIVE
Blood, UA: NEGATIVE
Glucose, UA: NEGATIVE
Ketones, UA: NEGATIVE
LEUKOCYTES UA: NEGATIVE
Nitrite, UA: NEGATIVE
PROTEIN UA: NEGATIVE
Spec Grav, UA: >=1.03 — AB (ref 1.010–1.025)
UROBILINOGEN UA: NEGATIVE U/dL — AB
pH, UA: 6 (ref 5.0–8.0)

## 2017-02-05 LAB — CBC
HCT: 41.6 % (ref 36.0–46.0)
HEMOGLOBIN: 13.7 g/dL (ref 12.0–15.0)
MCHC: 32.9 g/dL (ref 30.0–36.0)
MCV: 90 fl (ref 78.0–100.0)
PLATELETS: 280 10*3/uL (ref 150.0–400.0)
RBC: 4.62 Mil/uL (ref 3.87–5.11)
RDW: 12.6 % (ref 11.5–15.5)
WBC: 6.2 10*3/uL (ref 4.0–10.5)

## 2017-02-05 MED ORDER — ONDANSETRON HCL 8 MG PO TABS: 8.0000 mg | ORAL_TABLET | Freq: Three times a day (TID) | ORAL | 0 refills | Status: DC | PRN

## 2017-02-05 NOTE — Patient Instructions (Addendum)
For your history of kidney stone on prior CT and recent increased flank region pain, I feel like we need to repeat the CT in order to see if this stone in your kidney has moved into the ureter. Last time I treated you clinically and was hopeful that with time your symptoms would dissipate/stone would be passed. However now that symptoms are persisting/worsening  I do feel like we need to do the imaging study.  Also for diaphoresis and increased pain, I want to get a CBC and metabolic panel today.  For your nausea will prescribe Zofran. You have yet to use the pain medication. You are not working today or tomorrow so would encourage you to use that for the high level pain you described. Cautioned not to drive but since you are not working and at home I think it would be safe to use.  We'll follow the CT results. If it is confirmed that you have a stone in the ureter will try to get you in with urologist ASAP.  Follow-up date to be determined after study results.

## 2017-02-05 NOTE — Telephone Encounter (Signed)
Dr Lorelei Pont,  I wanted you to be aware of patient's recent visits/presentation. I have seen her 3 times now. Initially she presented like possible mild GERD type symptoms. Then later presentation changed and appeared more like renal colic. The CT abdomen and pelvis and hip showing 5 mm stone in the lower pole of right kidney. Later patient had more pain that was radiating towards right flank region. Most recently today I repeated the CT expecting to see that the stone moved but that is not the case(still in kidney). On that CT report showed normal appendix, normal gallbladder and pancreas.  When I last talked patient she reported that she had no pain for about 2 hours now.  She does have remote history of prior hysterectomy so I considered adhesion pain. Although initially her pain did not seem consistent with this.   It has been about 8 years from her last colonoscopy which was considered completely normal. Told to repeat in 10 years. Currently for caution sake advised her to do ifob to check for blood.  Incidental pulmonary nodule seen on the CT. I put in future reminder to repeat that in 6 months and advise patient..  Just wanted you to be aware of recent presentations and workup.  Thanks,  Percell Miller

## 2017-02-05 NOTE — Telephone Encounter (Signed)
Future ifob placed.

## 2017-02-05 NOTE — Progress Notes (Signed)
Subjective:    Patient ID: Sherry Butler, female    DOB: November 04, 1950, 66 y.o.   MRN: [id number]  HPI  Pt in states since I last saw her she has had persisting rt cva are pain that is radiating to her flank. Pain is constant during the day but pain is more severe at night. At night pain level 7-9/10. During the day pain level 5-6/10.   On initial work up recently pt was found to have rt side kidney stone 5 mm in lower portion of kidney. This was found on CT scan.  I had provided norco for pain. But she has not taken this for pain. She states she can't take due to working and driving.   No flomax was rx'd.  I did urine culture to see if had infection but showed no growth. No blood in urine today.  Pt has sweats at night with severe pain. But no known fevers.   Review of Systems  Constitutional: Negative for chills, diaphoresis, fatigue and fever.  HENT: Negative for congestion, ear discharge, ear pain, hearing loss, mouth sores, nosebleeds, postnasal drip and rhinorrhea.   Respiratory: Negative for chest tightness, shortness of breath and wheezing.   Cardiovascular: Negative for chest pain and palpitations.  Gastrointestinal: Negative for abdominal distention, anal bleeding, constipation, diarrhea, nausea and vomiting.       Flank pain  Genitourinary: Positive for flank pain. Negative for decreased urine volume, difficulty urinating, dyspareunia, dysuria, frequency, hematuria, pelvic pain, urgency and vaginal pain.  Musculoskeletal: Positive for back pain.       Rt cva area pain.  Skin: Negative for rash.  Neurological: Negative for dizziness, seizures, numbness and headaches.  Hematological: Negative for adenopathy. Does not bruise/bleed easily.  Psychiatric/Behavioral: Negative for behavioral problems, confusion and hallucinations. The patient is not nervous/anxious.     Past Medical History:  Diagnosis Date  . Depression   . Hypertension   . Vertigo      Social History     Social History  . Marital status: Married    Spouse name: N/A  . Number of children: N/A  . Years of education: N/A   Occupational History  . Not on file.   Social History Main Topics  . Smoking status: Never Smoker  . Smokeless tobacco: Never Used  . Alcohol use No  . Drug use: Unknown  . Sexual activity: Not on file   Other Topics Concern  . Not on file   Social History Narrative  . No narrative on file    Past Surgical History:  Procedure Laterality Date  . ABDOMINAL HYSTERECTOMY    . EYE SURGERY    . TUBAL LIGATION      No family history on file.  Allergies  Allergen Reactions  . Propoxyphene Nausea And Vomiting    Propoxyphene and Acetaminophen  . Demerol [Meperidine]   . Valium [Diazepam]     Current Outpatient Prescriptions on File Prior to Visit  Medication Sig Dispense Refill  . cholecalciferol (VITAMIN D) 1000 units tablet Take 1,000 Units by mouth daily.    . ciprofloxacin (CIPRO) 500 MG tablet Take 1 tablet (500 mg total) by mouth 2 (two) times daily. 6 tablet 0  . COD LIVER OIL PO Take 1 capsule by mouth daily.    . cyclobenzaprine (FLEXERIL) 10 MG tablet Take 1 tablet (10 mg total) by mouth at bedtime. Use as needed for hip pain 30 tablet 0  . escitalopram (LEXAPRO) 10 MG tablet Take 1  tablet (10 mg total) by mouth every other day. 90 tablet 1  . HYDROcodone-acetaminophen (NORCO) 5-325 MG tablet Take 1 tablet by mouth every 6 (six) hours as needed for moderate pain. 12 tablet 0  . lisinopril (PRINIVIL,ZESTRIL) 10 MG tablet Take 1 tablet (10 mg total) by mouth daily. 90 tablet 1  . lovastatin (MEVACOR) 20 MG tablet Take 1 tablet (20 mg total) by mouth at bedtime. 90 tablet 3  . meclizine (ANTIVERT) 25 MG tablet Take 1 tablet (25 mg total) by mouth 3 (three) times daily as needed for dizziness. 30 tablet 0  . meloxicam (MOBIC) 7.5 MG tablet Take 1 tablet (7.5 mg total) by mouth daily. 30 tablet 0  . thiamine (VITAMIN B-1) 100 MG tablet Take 100 mg  by mouth daily.     No current facility-administered medications on file prior to visit.     BP 133/80   Pulse (!) 55   Temp 97.7 F (36.5 C) (Oral)   Resp 16   Ht 5\' 10"  (1.778 m)   Wt 174 lb 9.6 oz (79.2 kg)   SpO2 98%   BMI 25.05 kg/m       Objective:   Physical Exam  General Appearance- Not in acute distress.  HEENT Eyes- Scleraeral/Conjuntiva-bilat- Not Yellow. Mouth & Throat- Normal.  Chest and Lung Exam Auscultation: Breath sounds:-Normal. Adventitious sounds:- No Adventitious sounds.  Cardiovascular Auscultation:Rythm - Regular. Heart Sounds -Normal heart sounds.  Abdomen Inspection:-Inspection Normal.  Palpation/Perucssion: Palpation and Percussion of the abdomen reveal- Mild to moderate flank tenderness to palpation. This area of pain seemed to correspond with course of the ureter. Patient does not have direct right lower quadrant pain. No heel jar pain. Though some mild tenderness medial to the right lower quadrant, No Rebound tenderness, No rigidity(Guarding) and No Palpable abdominal masses.  Liver:-Normal.  Spleen:- Normal.   Back- faint right CVA tenderness to palpation). No left-sided CVA tenderness.   Past Medical History:  Diagnosis Date  . Depression   . Hypertension   . Vertigo      Social History   Social History  . Marital status: Married    Spouse name: N/A  . Number of children: N/A  . Years of education: N/A   Occupational History  . Not on file.   Social History Main Topics  . Smoking status: Never Smoker  . Smokeless tobacco: Never Used  . Alcohol use No  . Drug use: Unknown  . Sexual activity: Not on file   Other Topics Concern  . Not on file   Social History Narrative  . No narrative on file    Past Surgical History:  Procedure Laterality Date  . ABDOMINAL HYSTERECTOMY    . EYE SURGERY    . TUBAL LIGATION      No family history on file.  Allergies  Allergen Reactions  . Propoxyphene Nausea And Vomiting     Propoxyphene and Acetaminophen  . Demerol [Meperidine]   . Valium [Diazepam]     Current Outpatient Prescriptions on File Prior to Visit  Medication Sig Dispense Refill  . cholecalciferol (VITAMIN D) 1000 units tablet Take 1,000 Units by mouth daily.    . ciprofloxacin (CIPRO) 500 MG tablet Take 1 tablet (500 mg total) by mouth 2 (two) times daily. 6 tablet 0  . COD LIVER OIL PO Take 1 capsule by mouth daily.    . cyclobenzaprine (FLEXERIL) 10 MG tablet Take 1 tablet (10 mg total) by mouth at bedtime. Use as needed  for hip pain 30 tablet 0  . escitalopram (LEXAPRO) 10 MG tablet Take 1 tablet (10 mg total) by mouth every other day. 90 tablet 1  . HYDROcodone-acetaminophen (NORCO) 5-325 MG tablet Take 1 tablet by mouth every 6 (six) hours as needed for moderate pain. 12 tablet 0  . lisinopril (PRINIVIL,ZESTRIL) 10 MG tablet Take 1 tablet (10 mg total) by mouth daily. 90 tablet 1  . lovastatin (MEVACOR) 20 MG tablet Take 1 tablet (20 mg total) by mouth at bedtime. 90 tablet 3  . meclizine (ANTIVERT) 25 MG tablet Take 1 tablet (25 mg total) by mouth 3 (three) times daily as needed for dizziness. 30 tablet 0  . meloxicam (MOBIC) 7.5 MG tablet Take 1 tablet (7.5 mg total) by mouth daily. 30 tablet 0  . thiamine (VITAMIN B-1) 100 MG tablet Take 100 mg by mouth daily.     No current facility-administered medications on file prior to visit.     BP 133/80   Pulse (!) 55   Temp 97.7 F (36.5 C) (Oral)   Resp 16   Ht 5\' 10"  (1.778 m)   Wt 174 lb 9.6 oz (79.2 kg)   SpO2 98%   BMI 25.05 kg/m       Assessment & Plan:  For your history of kidney stone on prior CT and recent increased flank region pain, I feel like we need to repeat the CT in order to see if this stone in your kidney has moved into the ureter. Last time I treated you clinically and was hopeful that with time your symptoms would dissipate. However now that symptoms are persisting I do feel like we need to do the imaging  study.  Also for diaphoresis and increased pain, I want to get a CBC and metabolic panel today.  For your nausea will prescribe Zofran. You have yet to use the pain medication. You are not working today or tomorrow so would encourage you to use that for the high level pain you described. Cautioned not to drive but since you are not working and at home I think it would be safe to use.  We'll follow the CT results. If it is confirmed that you have a stone in the ureter will try to get you in with urologist ASAP.  Follow-up date to be determined after study results.

## 2017-02-06 ENCOUNTER — Telehealth: Payer: Self-pay | Admitting: Family Medicine

## 2017-02-06 ENCOUNTER — Telehealth: Payer: Self-pay | Admitting: Medical

## 2017-02-06 DIAGNOSIS — N200 Calculus of kidney: Secondary | ICD-10-CM

## 2017-02-06 NOTE — Telephone Encounter (Signed)
Open to review.  

## 2017-02-06 NOTE — Telephone Encounter (Signed)
Referral to urologist placed. 

## 2017-02-06 NOTE — Telephone Encounter (Signed)
Dr. Lorelei Pont,  I did place urologist referral today. Will update you if patient's pain returns.  Thanks,  Percell Miller

## 2017-02-06 NOTE — Telephone Encounter (Signed)
Pt is requesting a call back, she said that she has been seen by provider for kidney stones and have a question to be advised.

## 2017-02-06 NOTE — Telephone Encounter (Signed)
Thanks so much Percell Miller, Are we referring her to urology? [place]

## 2017-02-07 NOTE — Telephone Encounter (Signed)
Notified pt of CT results.

## 2017-03-20 ENCOUNTER — Ambulatory Visit (HOSPITAL_BASED_OUTPATIENT_CLINIC_OR_DEPARTMENT_OTHER)
Admission: RE | Admit: 2017-03-20 | Discharge: 2017-03-20 | Disposition: A | Discharge status: Home / Self Care | Payer: PPO | Source: Ambulatory Visit | Attending: Medical | Admitting: Medical

## 2017-03-20 ENCOUNTER — Ambulatory Visit (INDEPENDENT_AMBULATORY_CARE_PROVIDER_SITE_OTHER): Payer: PPO | Admitting: Medical

## 2017-03-20 ENCOUNTER — Encounter: Payer: Self-pay | Admitting: Medical

## 2017-03-20 VITALS — BP 142/62 | HR 55 | Temp 98.1°F | Resp 16 | Ht 70.0 in | Wt 174.0 lb

## 2017-03-20 DIAGNOSIS — M25532 Pain in left wrist: Secondary | ICD-10-CM

## 2017-03-20 DIAGNOSIS — R42 Dizziness and giddiness: Secondary | ICD-10-CM | POA: Diagnosis not present

## 2017-03-20 MED ORDER — MECLIZINE HCL 25 MG PO TABS: 25.0000 mg | ORAL_TABLET | Freq: Three times a day (TID) | ORAL | 0 refills | Status: DC | PRN

## 2017-03-20 NOTE — Patient Instructions (Signed)
You do appear to have vertigo based on your description and physical exam.  You had no neurologic deficits on exam and that is good.  I will print to out Epley maneuvers to start.  Also you can use meclizine if you have dizziness/vertigo that is persistent.  If you have dizziness with associated neurologic signs and symptoms as we discussed today then recommend ED evaluation.  He had recent MRI in the past year but if you had worse symptoms as discussed you might need CT of head emergently.  Re: Review of your chart would strongly recommend you to keep that appointment with ENT regarding the parotid nodule that was recommended to remove.  For your left wrist pain will get x-ray today and advised continuing the wrist cockup splint.  After review of x-ray if the pain is persisting we could refer you to sports medicine.  Follow-up in 10 days or as needed.  How to Perform the Epley Maneuver The Epley maneuver is an exercise that relieves symptoms of vertigo. Vertigo is the feeling that you or your surroundings are moving when they are not. When you feel vertigo, you may feel like the room is spinning and have trouble walking. Dizziness is a little different than vertigo. When you are dizzy, you may feel unsteady or light-headed. You can do this maneuver at home whenever you have symptoms of vertigo. You can do it up to 3 times a day until your symptoms go away. Even though the Epley maneuver may relieve your vertigo for a few weeks, it is possible that your symptoms will return. This maneuver relieves vertigo, but it does not relieve dizziness. What are the risks? If it is done correctly, the Epley maneuver is considered safe. Sometimes it can lead to dizziness or nausea that goes away after a short time. If you develop other symptoms, such as changes in vision, weakness, or numbness, stop doing the maneuver and call your health care provider. How to perform the Epley maneuver 1. Sit on the edge of a  bed or table with your back straight and your legs extended or hanging over the edge of the bed or table. 2. Turn your head halfway toward the affected ear or side. 3. Lie backward quickly with your head turned until you are lying flat on your back. You may want to position a pillow under your shoulders. 4. Hold this position for 30 seconds. You may experience an attack of vertigo. This is normal. 5. Turn your head to the opposite direction until your unaffected ear is facing the floor. 6. Hold this position for 30 seconds. You may experience an attack of vertigo. This is normal. Hold this position until the vertigo stops. 7. Turn your whole body to the same side as your head. Hold for another 30 seconds. 8. Sit back up. You can repeat this exercise up to 3 times a day. Follow these instructions at home:  After doing the Epley maneuver, you can return to your normal activities.  Ask your health care provider if there is anything you should do at home to prevent vertigo. He or she may recommend that you: ? Keep your head raised (elevated) with two or more pillows while you sleep. ? Do not sleep on the side of your affected ear. ? Get up slowly from bed. ? Avoid sudden movements during the day. ? Avoid extreme head movement, like looking up or bending over. Contact a health care provider if:  Your vertigo gets worse.  You have other symptoms, including: ? Nausea. ? Vomiting. ? Headache. Get help right away if:  You have vision changes.  You have a severe or worsening headache or neck pain.  You cannot stop vomiting.  You have new numbness or weakness in any part of your body. Summary  Vertigo is the feeling that you or your surroundings are moving when they are not.  The Epley maneuver is an exercise that relieves symptoms of vertigo.  If the Epley maneuver is done correctly, it is considered safe. You can do it up to 3 times a day. This information is not intended to replace  advice given to you by your health care provider. Make sure you discuss any questions you have with your health care provider. Document Released: 05/12/2013 Document Revised: 03/27/2016 Document Reviewed: 03/27/2016 Elsevier Interactive Patient Education  2017 [employer].

## 2017-03-20 NOTE — Progress Notes (Signed)
Subjective:    Patient ID: Sherry Butler, female    DOB: November 05, 1950, 66 y.o.   MRN: [id number]  HPI  Pt in for follow up.  Pt states recently experiencing some dizziness. She states hx of vertigo for 12  years. She states recently worse when twisting her head and bending over.  Pt states sometimes in past around fall vertigo gets worse. Recent vertigo is making work difficult.  No trauma associated.   She does note that vertigo seemed to worsen when she went to mountains and then symptoms worse descending from mountain. But no ha or gross motor or sensory function deficits.   Recent cmp and cbc last month good.  Pt declines flu vaccine.   Review of Systems  Constitutional: Negative for chills and fever.  HENT: Negative for congestion, sinus pain, sinus pressure, tinnitus and trouble swallowing.        Pt has small nodule on parotid that she has chosen presently not to get surgery for. But following up with ent in January.  Respiratory: Negative for cough, chest tightness, shortness of breath and wheezing.   Cardiovascular: Negative for chest pain and palpitations.  Gastrointestinal: Negative for abdominal pain.  Musculoskeletal: Negative for back pain, joint swelling, neck pain and neck stiffness.       Left wrist pain. Pain for 2 weeks. Just work up with pain. Pt note sure what may have caused. Pt lifts a lot of things at wor.  Skin: Negative for rash.  Neurological: Positive for dizziness. Negative for weakness, numbness and headaches.       Pt recent mri of head negative except for parotid findings.   Hematological: Negative for adenopathy. Does not bruise/bleed easily.  Psychiatric/Behavioral: Negative for behavioral problems, confusion and sleep disturbance. The patient is not nervous/anxious.      Past Medical History:  Diagnosis Date  . Depression   . Hypertension   . Vertigo      Social History   Social History  . Marital status: Married    Spouse name:  N/A  . Number of children: N/A  . Years of education: N/A   Occupational History  . Not on file.   Social History Main Topics  . Smoking status: Never Smoker  . Smokeless tobacco: Never Used  . Alcohol use No  . Drug use: Unknown  . Sexual activity: Not on file   Other Topics Concern  . Not on file   Social History Narrative  . No narrative on file    Past Surgical History:  Procedure Laterality Date  . ABDOMINAL HYSTERECTOMY    . EYE SURGERY    . TUBAL LIGATION      No family history on file.  Allergies  Allergen Reactions  . Propoxyphene Nausea And Vomiting    Propoxyphene and Acetaminophen  . Demerol [Meperidine]   . Valium [Diazepam]     Current Outpatient Prescriptions on File Prior to Visit  Medication Sig Dispense Refill  . cholecalciferol (VITAMIN D) 1000 units tablet Take 1,000 Units by mouth daily.    . COD LIVER OIL PO Take 1 capsule by mouth daily.    . cyclobenzaprine (FLEXERIL) 10 MG tablet Take 1 tablet (10 mg total) by mouth at bedtime. Use as needed for hip pain 30 tablet 0  . escitalopram (LEXAPRO) 10 MG tablet Take 1 tablet (10 mg total) by mouth every other day. 90 tablet 1  . lisinopril (PRINIVIL,ZESTRIL) 10 MG tablet Take 1 tablet (10 mg total) by  mouth daily. 90 tablet 1  . lovastatin (MEVACOR) 20 MG tablet Take 1 tablet (20 mg total) by mouth at bedtime. 90 tablet 3  . thiamine (VITAMIN B-1) 100 MG tablet Take 100 mg by mouth daily.    Marland Kitchen HYDROcodone-acetaminophen (NORCO) 5-325 MG tablet Take 1 tablet by mouth every 6 (six) hours as needed for moderate pain. (Patient not taking: Reported on 03/20/2017) 12 tablet 0  . meclizine (ANTIVERT) 25 MG tablet Take 1 tablet (25 mg total) by mouth 3 (three) times daily as needed for dizziness. (Patient not taking: Reported on 03/20/2017) 30 tablet 0  . meloxicam (MOBIC) 7.5 MG tablet Take 1 tablet (7.5 mg total) by mouth daily. (Patient not taking: Reported on 03/20/2017) 30 tablet 0  . ondansetron  (ZOFRAN) 8 MG tablet Take 1 tablet (8 mg total) by mouth every 8 (eight) hours as needed for nausea or vomiting. (Patient not taking: Reported on 03/20/2017) 20 tablet 0   No current facility-administered medications on file prior to visit.     BP (!) 142/62   Pulse (!) 55   Temp 98.1 F (36.7 C) (Oral)   Resp 16   Ht 5\' 10"  (1.778 m)   Wt 174 lb (78.9 kg)   SpO2 98%   BMI 24.97 kg/m       Objective:   Physical Exam  General Mental Status- Alert. General Appearance- Not in acute distress.   Skin General: Color- Normal Color. Moisture- Normal Moisture.  Neck Carotid Arteries- Normal color. Moisture- Normal Moisture. No carotid bruits. No JVD.  Chest and Lung Exam Auscultation: Breath Sounds:-Normal.  Cardiovascular Auscultation:Rythm- Regular. Murmurs & Other Heart Sounds:Auscultation of the heart reveals- No Murmurs.  Abdomen Inspection:-Inspeection Normal. Palpation/Percussion:Note:No mass. Palpation and Percussion of the abdomen reveal- Non Tender, Non Distended + BS, no rebound or guarding.   Neurologic Cranial Nerve exam:- CN III-XII intact(No nystagmus), symmetric smile. Drift Test:- No drift. Romberg Exam:- Negative.  Heal to Toe Gait exam:-Normal. Finger to Nose:- Normal/Intact Strength:- 5/5 equal and symmetric strength both upper and lower extremities.  On lying supine and turning head to left vertigo induced. Last for about 30-45 seconds then subsided.  Left wrist- pain on palpation of wrist both ventral and dorsal aspect.Pain on flexion and extension.      Assessment & Plan:  You do appear to have vertigo based on your description and physical exam.  You had no neurologic deficits on exam and that is good.  I will print to out Epley maneuvers to start.  Also you can use meclizine if you have dizziness/vertigo that is persistent.  If you have dizziness with associated neurologic signs and symptoms as we discussed today then recommend ED  evaluation.  He had recent MRI in the past year but if you had worse symptoms as discussed you might need CT of head emergently.  Re: Review of your chart would strongly recommend you to keep that appointment with ENT regarding the parotid nodule that was recommended to remove.  For your left wrist pain will get x-ray today and advised continuing the wrist cockup splint.  After review of x-ray if the pain is persisting we could refer you to sports medicine.  Follow-up in 10 days or as needed.

## 2017-03-21 ENCOUNTER — Telehealth: Payer: Self-pay | Admitting: Medical

## 2017-03-21 NOTE — Telephone Encounter (Signed)
Patient returning phone call 

## 2017-03-21 NOTE — Telephone Encounter (Signed)
Pt returning call. Message routed to me. But I did not call her. But I think resulted labs.

## 2017-03-22 ENCOUNTER — Telehealth: Payer: Self-pay | Admitting: Medical

## 2017-03-22 DIAGNOSIS — M25532 Pain in left wrist: Secondary | ICD-10-CM

## 2017-03-22 NOTE — Telephone Encounter (Signed)
Notified pt. Pt states she has had pain about 3 weeks. Pt would like referral too sports medicine

## 2017-03-22 NOTE — Telephone Encounter (Signed)
Referral to sports medicine placed

## 2017-03-26 ENCOUNTER — Ambulatory Visit: Payer: PPO | Admitting: Family Medicine

## 2017-03-26 ENCOUNTER — Encounter: Payer: Self-pay | Admitting: Family Medicine

## 2017-03-26 DIAGNOSIS — M25532 Pain in left wrist: Secondary | ICD-10-CM | POA: Diagnosis not present

## 2017-03-26 NOTE — Patient Instructions (Signed)
You have a combination of dequervains tenosynovitis, mild arthritis of the thumb, and synovitis of the wrist. Wear thumb spica brace as often as possible the next 6 weeks (don't drive with this on). Icing 15 minutes at a time 3-4 times a day. Ibuprofen 600mg  three times a day with food for pain and inflammation - take for 7-10 days then as needed. Consider occupational therapy if not improving. Follow up with me in 6 weeks.

## 2017-03-28 ENCOUNTER — Encounter: Payer: Self-pay | Admitting: Family Medicine

## 2017-03-28 DIAGNOSIS — H43812 Vitreous degeneration, left eye: Secondary | ICD-10-CM | POA: Diagnosis not present

## 2017-03-28 DIAGNOSIS — H43811 Vitreous degeneration, right eye: Secondary | ICD-10-CM | POA: Diagnosis not present

## 2017-03-28 DIAGNOSIS — M25532 Pain in left wrist: Secondary | ICD-10-CM | POA: Insufficient documentation

## 2017-03-28 DIAGNOSIS — H32 Chorioretinal disorders in diseases classified elsewhere: Secondary | ICD-10-CM | POA: Diagnosis not present

## 2017-03-28 DIAGNOSIS — H43813 Vitreous degeneration, bilateral: Secondary | ICD-10-CM | POA: Diagnosis not present

## 2017-03-28 DIAGNOSIS — B399 Histoplasmosis, unspecified: Secondary | ICD-10-CM | POA: Diagnosis not present

## 2017-03-28 NOTE — Progress Notes (Signed)
PCP: Copland, Gay Filler, MD Consultation requested by: Mackie Pai PA-C  Subjective:   HPI: Patient is a 66 y.o. female here for left wrist pain.  Patient reports she's had about 4 weeks of dorsal left wrist pain. 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 No numbness or tingling. Radiates up to elbow and into thumb. Associated swelling. Tried ibuprofen and brace which both help some. Pain is 2/10 but up to 10/10 and sharp at worst. No skin changes.  Past Medical History:  Diagnosis Date  . Depression   . Hypertension   . Vertigo     Current Outpatient Medications on File Prior to Visit  Medication Sig Dispense Refill  . cholecalciferol (VITAMIN D) 1000 units tablet Take 1,000 Units by mouth daily.    . COD LIVER OIL PO Take 1 capsule by mouth daily.    . cyclobenzaprine (FLEXERIL) 10 MG tablet Take 1 tablet (10 mg total) by mouth at bedtime. Use as needed for hip pain 30 tablet 0  . escitalopram (LEXAPRO) 10 MG tablet Take 1 tablet (10 mg total) by mouth every other day. 90 tablet 1  . HYDROcodone-acetaminophen (NORCO) 5-325 MG tablet Take 1 tablet by mouth every 6 (six) hours as needed for moderate pain. (Patient not taking: Reported on 03/20/2017) 12 tablet 0  . lisinopril (PRINIVIL,ZESTRIL) 10 MG tablet Take 1 tablet (10 mg total) by mouth daily. 90 tablet 1  . lovastatin (MEVACOR) 20 MG tablet Take 1 tablet (20 mg total) by mouth at bedtime. 90 tablet 3  . meclizine (ANTIVERT) 25 MG tablet Take 1 tablet (25 mg total) by mouth 3 (three) times daily as needed for dizziness. 30 tablet 0  . meloxicam (MOBIC) 7.5 MG tablet Take 1 tablet (7.5 mg total) by mouth daily. (Patient not taking: Reported on 03/20/2017) 30 tablet 0  . ondansetron (ZOFRAN) 8 MG tablet Take 1 tablet (8 mg total) by mouth every 8 (eight) hours as needed for nausea or vomiting. (Patient not taking: Reported on 03/20/2017) 20  tablet 0  . thiamine (VITAMIN B-1) 100 MG tablet Take 100 mg by mouth daily.     No current facility-administered medications on file prior to visit.     Past Surgical History:  Procedure Laterality Date  . ABDOMINAL HYSTERECTOMY    . EYE SURGERY    . TUBAL LIGATION      Allergies  Allergen Reactions  . Propoxyphene Nausea And Vomiting    Propoxyphene and Acetaminophen  . Demerol [Meperidine]   . Valium [Diazepam]     Social History   Socioeconomic History  . Marital status: Married    Spouse name: Not on file  . Number of children: Not on file  . Years of education: Not on file  . Highest education level: Not on file  Social Needs  . Financial resource strain: Not on file  . Food insecurity - worry: Not on file  . Food insecurity - inability: Not on file  . Transportation needs - medical: Not on file  . Transportation needs - non-medical: Not on file  Occupational History  . Not on file  Tobacco Use  . Smoking status: Never Smoker  . Smokeless tobacco: Never Used  Substance and Sexual Activity  . Alcohol use: No  . Drug use: Not on file  . Sexual activity: Not on file  Other Topics Concern  . Not on file  Social  History Narrative  . Not on file    History reviewed. No pertinent family history.  BP 138/76   Pulse (!) 56   Ht 5\' 10"  (1.778 m)   Wt 170 lb (77.1 kg)   BMI 24.39 kg/m   Review of Systems: See HPI above.     Objective:  Physical Exam:  Gen: NAD, comfortable in exam room  Left wrist/hand: No gross deformity, swelling, bruising, atrophy. TTP dorsally over wrist joint, 1st dorsal compartment, 1st MCP joint.  No other tenderness. FROM digits and wrist.  Pain with thumb flexion and extension.  5/5 strength all digits. Collateral ligaments intact of thumb at MCP joint. Negative tinels, phalens.  Negative finkelsteins. NVI distally.  Right wrist/hand: No gross deformity. No TTP. FROM digits and wrist with 5/5 strength. NVI  distally.   Assessment & Plan:  1. 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  Thumb spica brace for next 6 weeks.  Icing, ibuprofen regularly.  Consider occupational therapy if not improving.  F/u in 6 weeks.

## 2017-03-28 NOTE — Assessment & Plan Note (Signed)
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  Thumb spica brace for next 6 weeks.  Icing, ibuprofen regularly.  Consider occupational therapy if not improving.  F/u in 6 weeks.

## 2017-04-17 ENCOUNTER — Other Ambulatory Visit: Payer: Self-pay | Admitting: Family Medicine

## 2017-04-24 DIAGNOSIS — N200 Calculus of kidney: Secondary | ICD-10-CM | POA: Diagnosis not present

## 2017-04-24 DIAGNOSIS — Q6211 Congenital occlusion of ureteropelvic junction: Secondary | ICD-10-CM | POA: Diagnosis not present

## 2017-05-07 ENCOUNTER — Ambulatory Visit: Payer: PPO | Admitting: Family Medicine

## 2017-05-07 ENCOUNTER — Encounter: Payer: Self-pay | Admitting: Family Medicine

## 2017-05-07 DIAGNOSIS — M25532 Pain in left wrist: Secondary | ICD-10-CM | POA: Diagnosis not present

## 2017-05-07 NOTE — Progress Notes (Signed)
PCP: Copland, Gay Filler, MD Consultation requested by: Mackie Pai PA-C  Subjective:   HPI: Patient is a 66 y.o. female here for left wrist pain.  11/6: Patient reports she's had about 4 weeks of dorsal left wrist pain. 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 No numbness or tingling. Radiates up to elbow and into thumb. Associated swelling. Tried ibuprofen and brace which both help some. Pain is 2/10 but up to 10/10 and sharp at worst. No skin changes.  12/18: Patient reports her pain completely improved until about 3 days ago when she started to get pain on volar aspect of left wrist. No new injury or trauma. Worse with flexion. Radiates up to wrist. Ibuprofen helps. Pain up to 5-6/10 and sharp. No skin changes, numbness.  Past Medical History:  Diagnosis Date  . Depression   . Hypertension   . Vertigo     Current Outpatient Medications on File Prior to Visit  Medication Sig Dispense Refill  . cholecalciferol (VITAMIN D) 1000 units tablet Take 1,000 Units by mouth daily.    . COD LIVER OIL PO Take 1 capsule by mouth daily.    . cyclobenzaprine (FLEXERIL) 10 MG tablet Take 1 tablet (10 mg total) by mouth at bedtime. Use as needed for hip pain 30 tablet 0  . escitalopram (LEXAPRO) 10 MG tablet Take 1 tablet (10 mg total) by mouth every other day. 90 tablet 1  . HYDROcodone-acetaminophen (NORCO) 5-325 MG tablet Take 1 tablet by mouth every 6 (six) hours as needed for moderate pain. (Patient not taking: Reported on 03/20/2017) 12 tablet 0  . lisinopril (PRINIVIL,ZESTRIL) 10 MG tablet TAKE 1 TABLET BY MOUTH ONCE DAILY 90 tablet 1  . lovastatin (MEVACOR) 20 MG tablet Take 1 tablet (20 mg total) by mouth at bedtime. 90 tablet 3  . meclizine (ANTIVERT) 25 MG tablet Take 1 tablet (25 mg total) by mouth 3 (three) times daily as needed for dizziness. 30 tablet 0  . meloxicam (MOBIC) 7.5 MG tablet  Take 1 tablet (7.5 mg total) by mouth daily. (Patient not taking: Reported on 03/20/2017) 30 tablet 0  . ondansetron (ZOFRAN) 8 MG tablet Take 1 tablet (8 mg total) by mouth every 8 (eight) hours as needed for nausea or vomiting. (Patient not taking: Reported on 03/20/2017) 20 tablet 0  . thiamine (VITAMIN B-1) 100 MG tablet Take 100 mg by mouth daily.     No current facility-administered medications on file prior to visit.     Past Surgical History:  Procedure Laterality Date  . ABDOMINAL HYSTERECTOMY    . EYE SURGERY    . TUBAL LIGATION      Allergies  Allergen Reactions  . Propoxyphene Nausea And Vomiting    Propoxyphene and Acetaminophen  . Demerol [Meperidine]   . Valium [Diazepam]     Social History   Socioeconomic History  . Marital status: Married    Spouse name: Not on file  . Number of children: Not on file  . Years of education: Not on file  . Highest education level: Not on file  Social Needs  . Financial resource strain: Not on file  . Food insecurity - worry: Not on file  . Food insecurity - inability: Not on file  . Transportation needs - medical: Not on file  . Transportation needs - non-medical: Not on file  Occupational History  . Not on file  Tobacco  Use  . Smoking status: Never Smoker  . Smokeless tobacco: Never Used  Substance and Sexual Activity  . Alcohol use: No  . Drug use: Not on file  . Sexual activity: Not on file  Other Topics Concern  . Not on file  Social History Narrative  . Not on file    History reviewed. No pertinent family history.  BP (!) 150/82   Pulse (!) 52   Ht 5\' 10"  (1.778 m)   Wt 174 lb (78.9 kg)   BMI 24.97 kg/m   Review of Systems: See HPI above.     Objective:  Physical Exam:  Gen: NAD, comfortable in exam room.  Left wrist/hand: No gross deformity, swelling, bruising. TTP mildly over volar wrist, carpal tunnel.  No other tenderness of wrist or hand. FROM digits and wrist with mild pain on wrist  flexion only. 5/5 strength all digits and wrist. Negative tinels, finkelsteins. NVI distally.   Assessment & Plan:  1. Left wrist pain - Separate issue compared to last visit - 2/2 flexor tendinitis of wrist.  Wrist brace, icing, ibuprofen for 7-10 days then as needed.  Consider OT if not improving.  F/u in 6 weeks only if not improving as expected.

## 2017-05-07 NOTE — Patient Instructions (Signed)
You have a flexor tendinitis of your wrist. Wear the regular wrist brace as often as possible the next 6 weeks (don't drive with this on). Icing 15 minutes at a time 3-4 times a day. Ibuprofen 600mg  three times a day with food for pain and inflammation - take for 7-10 days then as needed. Consider occupational therapy if not improving. Follow up with me in 6 weeks only if you're not improving as expected.

## 2017-05-07 NOTE — Assessment & Plan Note (Signed)
Separate issue compared to last visit - 2/2 flexor tendinitis of wrist.  Wrist brace, icing, ibuprofen for 7-10 days then as needed.  Consider OT if not improving.  F/u in 6 weeks only if not improving as expected.

## 2017-05-17 ENCOUNTER — Encounter: Payer: Self-pay | Admitting: Family Medicine

## 2017-05-17 ENCOUNTER — Ambulatory Visit: Payer: PPO | Admitting: Family Medicine

## 2017-05-17 VITALS — BP 152/85 | HR 56 | Temp 97.6°F | Resp 16 | Ht 71.0 in | Wt 174.2 lb

## 2017-05-17 DIAGNOSIS — S46811A Strain of other muscles, fascia and tendons at shoulder and upper arm level, right arm, initial encounter: Secondary | ICD-10-CM | POA: Diagnosis not present

## 2017-05-17 MED ORDER — TIZANIDINE HCL 4 MG PO CAPS: 4.0000 mg | ORAL_CAPSULE | Freq: Three times a day (TID) | ORAL | 0 refills | Status: DC | PRN

## 2017-05-17 NOTE — Progress Notes (Signed)
Musculoskeletal Exam  Patient: Sherry Butler DOB: August 05, 1950  DOS: 05/17/2017  SUBJECTIVE:  Chief Complaint:   Chief Complaint  Patient presents with  . Shoulder Pain    pt has right shoulder pain     Sherry Butler is a 66 y.o.  female for evaluation and treatment of R shoulder pain.   Onset:  2 weeks ago.  No injury or known incident that started it.  Location: Between shoulder blades Character:  burning and sharp  Progression of issue:  has slightly worsened Associated symptoms: none Treatment: to date has been none.   Neurovascular symptoms: no  ROS: Musculoskeletal/Extremities: +R shoulder pain  Past Medical History:  Diagnosis Date  . Depression   . Hypertension   . Vertigo     Objective: VITAL SIGNS: BP (!) 152/85   Pulse (!) 56   Temp 97.6 F (36.4 C) (Oral)   Resp 16   Ht 5\' 11"  (1.803 m)   Wt 174 lb 3.2 oz (79 kg)   SpO2 98%   BMI 24.30 kg/m  Constitutional: Well formed, well developed. No acute distress. Cardiovascular: Brisk cap refill Thorax & Lungs: No accessory muscle use Musculoskeletal: R shoulder.   Normal active range of motion: yes.   Normal passive range of motion: yes Tenderness to palpation: yes over R trap Deformity: no Ecchymosis: no Tests positive: None Tests negative: empty can, Neer's, Hawkins, Lift off, Speed's, Cross over Neurologic: Normal sensory function. No focal deficits noted. DTR's equal and symmetry in UE's. No clonus. Psychiatric: Normal mood. Age appropriate judgment and insight. Alert & oriented x 3.    Assessment:  Strain of right trapezius muscle, initial encounter - Plan: tiZANidine (ZANAFLEX) 4 MG capsule  Plan: Orders as above. NSAID, Tylenol, heat, ice, stretches/exercises.  Pt had popcorn yesterday and states her BP is high from that. Monitor.  F/u in 3-4 weeks if no better, consider PT. The patient voiced understanding and agreement to the plan.   [place]ili, DO 05/17/17  8:29  AM

## 2017-05-17 NOTE — Patient Instructions (Addendum)
Ice/cold pack over area for 10-15 min every 2-3 hours while awake.  Heat (pad or rice pillow in microwave) over affected area, 10-15 minutes every 2-3 hours while awake.   OK to take Tylenol 1000 mg (2 extra strength tabs) or 975 mg (3 regular strength tabs) every 6 hours as needed.  Ibuprofen 400-600 mg (2-3 over the counter strength tabs) every 6 hours as needed for pain.  Take muscle relaxant 1-2 hours before planned bedtime. If it makes you drowsy, do not take during the day. You can try half a tab the following night.  Trapezius stretches/exercises It is normal to feel mild stretching, pulling, tightness, or discomfort as you do these exercises, but you should stop right away if you feel sudden pain or your pain gets worse.  Stretching and range of motion exercises These exercises warm up your muscles and joints and improve the movement and flexibility of your shoulder. These exercises can also help to relieve pain, numbness, and tingling. If you are unable to do any of the following for any reason, do not further attempt to do it.   Exercise A: Flexion, standing    1. Stand and hold a broomstick, a cane, or a similar object. Place your hands a little more than shoulder-width apart on the object. Your left / right hand should be palm-up, and your other hand should be palm-down. 2. Push the stick to raise your left / right arm out to your side and then over your head. Use your other hand to help move the stick. Stop when you feel a stretch in your shoulder, or when you reach the angle that is recommended by your health care provider. ? Avoid shrugging your shoulder while you raise your arm. Keep your shoulder blade tucked down toward your spine. 3. Hold for 30 seconds. 4. Slowly return to the starting position. Repeat 2 times. Complete this exercise 3 times per week.  Exercise B: Abduction, supine    1. Lie on your back and hold a broomstick, a cane, or a similar object. Place  your hands a little more than shoulder-width apart on the object. Your left / right hand should be palm-up, and your other hand should be palm-down. 2. Push the stick to raise your left / right arm out to your side and then over your head. Use your other hand to help move the stick. Stop when you feel a stretch in your shoulder, or when you reach the angle that is recommended by your health care provider. ? Avoid shrugging your shoulder while you raise your arm. Keep your shoulder blade tucked down toward your spine. 3. Hold for 30 seconds. 4. Slowly return to the starting position. Repeat 2 times. Complete this exercise 3 times per week.  Exercise C: Flexion, active-assisted    1. Lie on your back. You may bend your knees for comfort. 2. Hold a broomstick, a cane, or a similar object. Place your hands about shoulder-width apart on the object. Your palms should face toward your feet. 3. Raise the stick and move your arms over your head and behind your head, toward the floor. Use your healthy arm to help your left / right arm move farther. Stop when you feel a gentle stretch in your shoulder, or when you reach the angle where your health care provider tells you to stop. 4. Hold for 30 seconds. 5. Slowly return to the starting position. Repeat 2 times. Complete this exercise 3 times per week.  Exercise D: External rotation and abduction    1. Stand in a door frame with one of your feet slightly in front of the other. This is called a staggered stance. 2. Choose one of the following positions as told by your health care provider: ? Place your hands and forearms on the door frame above your head. ? Place your hands and forearms on the door frame at the height of your head. ? Place your hands on the door frame at the height of your elbows. 3. Slowly move your weight onto your front foot until you feel a stretch across your chest and in the front of your shoulders. Keep your head and chest  upright and keep your abdominal muscles tight. 4. Hold for 30 seconds. 5. To release the stretch, shift your weight to your back foot. Repeat 2 times. Complete this stretch 3 times per week.  Strengthening exercises These exercises build strength and endurance in your shoulder. Endurance is the ability to use your muscles for a long time, even after your muscles get tired. Exercise E: Scapular depression and adduction  1. Sit on a stable chair. Support your arms in front of you with pillows, armrests, or a tabletop. Keep your elbows in line with the sides of your body. 2. Gently move your shoulder blades down toward your middle back. Relax the muscles on the tops of your shoulders and in the back of your neck. 3. Hold for 3 seconds. 4. Slowly release the tension and relax your muscles completely before doing this exercise again. Repeat for a total of 10 repetitions. 5. After you have practiced this exercise, try doing the exercise without the arm support. Then, try the exercise while standing instead of sitting. Repeat 2 times. Complete this exercise 3 times per week.  Exercise F: Shoulder abduction, isometric    1. Stand or sit about 4-6 inches (10-15 cm) from a wall with your left / right side facing the wall. 2. Bend your left / right elbow and gently press your elbow against the wall. 3. Increase the pressure slowly until you are pressing as hard as you can without shrugging your shoulder. 4. Hold for 3 seconds. 5. Slowly release the tension and relax your muscles completely. Repeat for a total of 10 repetitions. Repeat 2 times. Complete this exercise 3 times per week.  Exercise G: Shoulder flexion, isometric    1. Stand or sit about 4-6 inches (10-15 cm) away from a wall with your left / right side facing the wall. 2. Keep your left / right elbow straight and gently press the top of your fist against the wall. Increase the pressure slowly until you are pressing as hard as you can  without shrugging your shoulder. 3. Hold for 10-15 seconds. 4. Slowly release the tension and relax your muscles completely. Repeat for a total of 10 repetitions. Repeat 2 times. Complete this exercise 3 times per week.  Exercise H: Internal rotation    1. Sit in a stable chair without armrests, or stand. Secure an exercise band at your left / right side, at elbow height. 2. Place a soft object, such as a folded towel or a small pillow, under your left / right upper arm so your elbow is a few inches (about 8 cm) away from your side. 3. Hold the end of the exercise band so the band stretches. 4. Keeping your elbow pressed against the soft object under your arm, move your forearm across your body toward  your abdomen. Keep your body steady so the movement is only coming from your shoulder. 5. Hold for 3 seconds. 6. Slowly return to the starting position. Repeat for a total of 10 repetitions. Repeat 2 times. Complete this exercise 3 times per week.  Exercise I: External rotation    1. Sit in a stable chair without armrests, or stand. 2. Secure an exercise band at your left / right side, at elbow height. 3. Place a soft object, such as a folded towel or a small pillow, under your left / right upper arm so your elbow is a few inches (about 8 cm) away from your side. 4. Hold the end of the exercise band so the band stretches. 5. Keeping your elbow pressed against the soft object under your arm, move your forearm out, away from your abdomen. Keep your body steady so the movement is only coming from your shoulder. 6. Hold for 3 seconds. 7. Slowly return to the starting position. Repeat for a total of 10 repetitions. Repeat 2 times. Complete this exercise 3 times per week. Exercise J: Shoulder extension  1. Sit in a stable chair without armrests, or stand. Secure an exercise band to a stable object in front of you so the band is at shoulder height. 2. Hold one end of the exercise band in each  hand. Your palms should face each other. 3. Straighten your elbows and lift your hands up to shoulder height. 4. Step back, away from the secured end of the exercise band, until the band stretches. 5. Squeeze your shoulder blades together and pull your hands down to the sides of your thighs. Stop when your hands are straight down by your sides. Do not let your hands go behind your body. 6. Hold for 3 seconds. 7. Slowly return to the starting position. Repeat for a total of 10 repetitions. Repeat 2 times. Complete this exercise 3 times per week.  Exercise K: Shoulder extension, prone    1. Lie on your abdomen on a firm surface so your left / right arm hangs over the edge. 2. Hold a 5 lb weight in your hand so your palm faces in toward your body. Your arm should be straight. 3. Squeeze your shoulder blade down toward the middle of your back. 4. Slowly raise your arm behind you, up to the height of the surface that you are lying on. Keep your arm straight. 5. Hold for 3 seconds. 6. Slowly return to the starting position and relax your muscles. Repeat for a total of 10 repetitions. Repeat 2 times. Complete this exercise 3 times per week.   Exercise L: Horizontal abduction, prone  1. Lie on your abdomen on a firm surface so your left / right arm hangs over the edge. 2. Hold a 5 lb weight in your hand so your palm faces toward your feet. Your arm should be straight. 3. Squeeze your shoulder blade down toward the middle of your back. 4. Bend your elbow so your hand moves up, until your elbow is bent to an "L" shape (90 degrees). With your elbow bent, slowly move your forearm forward and up. Raise your hand up to the height of the surface that you are lying on. ? Your upper arm should not move, and your elbow should stay bent. ? At the top of the movement, your palm should face the floor. 5. Hold for 3 seconds. 6. Slowly return to the starting position and relax your muscles. Repeat for a total  of 10 repetitions. Repeat 2 times. Complete this exercise 3 times per week.  Exercise M: Horizontal abduction, standing  1. Sit on a stable chair, or stand. 2. Secure an exercise band to a stable object in front of you so the band is at shoulder height. 3. Hold one end of the exercise band in each hand. 4. Straighten your elbows and lift your hands straight in front of you, up to shoulder height. Your palms should face down, toward the floor. 5. Step back, away from the secured end of the exercise band, until the band stretches. 6. Move your arms out to your sides, and keep your arms straight. 7. Hold for 3 seconds. 8. Slowly return to the starting position. Repeat for a total of 10 repetitions. Repeat 2 times. Complete this exercise 3 times per week.  Exercise N: Scapular retraction and elevation  1. Sit on a stable chair, or stand. 2. Secure an exercise band to a stable object in front of you so the band is at shoulder height. 3. Hold one end of the exercise band in each hand. Your palms should face each other. 4. Sit in a stable chair without armrests, or stand. 5. Step back, away from the secured end of the exercise band, until the band stretches. 6. Squeeze your shoulder blades together and lift your hands over your head. Keep your elbows straight. 7. Hold for 3 seconds. 8. Slowly return to the starting position. Repeat for a total of 10 repetitions. Repeat 2 times. Complete this exercise 3 times per week.  Make sure you discuss any questions you have with your health care provider. Document Released: 05/07/2005 Document Revised: 01/12/2016 Document Reviewed: 03/24/2015 Elsevier Interactive Patient Education  2017 [employer].

## 2017-06-12 ENCOUNTER — Ambulatory Visit: Payer: PPO | Admitting: Medical

## 2017-06-17 ENCOUNTER — Ambulatory Visit (HOSPITAL_BASED_OUTPATIENT_CLINIC_OR_DEPARTMENT_OTHER)
Admission: RE | Admit: 2017-06-17 | Discharge: 2017-06-17 | Disposition: A | Discharge status: Home / Self Care | Payer: PPO | Source: Ambulatory Visit | Attending: Medical | Admitting: Medical

## 2017-06-17 ENCOUNTER — Encounter: Payer: Self-pay | Admitting: Medical

## 2017-06-17 ENCOUNTER — Ambulatory Visit (INDEPENDENT_AMBULATORY_CARE_PROVIDER_SITE_OTHER): Payer: PPO | Admitting: Medical

## 2017-06-17 VITALS — BP 142/83 | HR 63 | Temp 98.1°F | Resp 16 | Ht 71.0 in | Wt 172.8 lb

## 2017-06-17 DIAGNOSIS — G8929 Other chronic pain: Secondary | ICD-10-CM | POA: Diagnosis not present

## 2017-06-17 DIAGNOSIS — R0781 Pleurodynia: Secondary | ICD-10-CM | POA: Insufficient documentation

## 2017-06-17 DIAGNOSIS — M545 Low back pain, unspecified: Secondary | ICD-10-CM

## 2017-06-17 DIAGNOSIS — R079 Chest pain, unspecified: Secondary | ICD-10-CM | POA: Diagnosis not present

## 2017-06-17 DIAGNOSIS — M898X1 Other specified disorders of bone, shoulder: Secondary | ICD-10-CM

## 2017-06-17 DIAGNOSIS — M5431 Sciatica, right side: Secondary | ICD-10-CM

## 2017-06-17 DIAGNOSIS — S46811D Strain of other muscles, fascia and tendons at shoulder and upper arm level, right arm, subsequent encounter: Secondary | ICD-10-CM | POA: Diagnosis not present

## 2017-06-17 DIAGNOSIS — M25511 Pain in right shoulder: Secondary | ICD-10-CM | POA: Diagnosis not present

## 2017-06-17 LAB — POC URINALSYSI DIPSTICK (AUTOMATED)
Bilirubin, UA: NEGATIVE
Blood, UA: NEGATIVE
Glucose, UA: NEGATIVE
Ketones, UA: NEGATIVE
Leukocytes, UA: NEGATIVE
Nitrite, UA: NEGATIVE
Protein, UA: NEGATIVE
Spec Grav, UA: 1.025
Urobilinogen, UA: NEGATIVE U/dL — AB
pH, UA: 6

## 2017-06-17 MED ORDER — CYCLOBENZAPRINE HCL 5 MG PO TABS: 5.0000 mg | ORAL_TABLET | Freq: Three times a day (TID) | ORAL | 0 refills | Status: DC | PRN

## 2017-06-17 MED ORDER — DICLOFENAC POTASSIUM 50 MG PO TABS: 50.0000 mg | ORAL_TABLET | Freq: Two times a day (BID) | ORAL | 0 refills | Status: DC

## 2017-06-17 NOTE — Progress Notes (Signed)
Subjective:    Patient ID: Sherry Butler, female    DOB: April 22, 1951, 67 y.o.   MRN: [id number]  HPI  Pt in with some rt side si area pain/rt cva area pain all day yesterday. Pt states pain on and off intermittent. This pain is more transient and sharp related to changing different positions. No blood in urine. Urine looks more concentrated. Pt can find comfortable position. Pt actually shows me (points)that pain is more parspinal on rt side lumbar area.  Pt also has some rt side trapezius are pain. Pain is actually worse than the above. Pain has been present for 2 months. Pt has been using muscle relaxants but has not helped much. Pt has used ibuprofen and it does help. Some times has pleuritic type pain. Some pain when she breaths deeply.     Review of Systems  Constitutional: Negative for chills, fatigue and fever.  Respiratory: Negative for cough, chest tightness, shortness of breath and wheezing.        Pleuritic pain.  Cardiovascular: Negative for chest pain and palpitations.  Gastrointestinal: Negative for abdominal pain.  Musculoskeletal: Positive for back pain.       Scapula pain.  Back pain right SI area.  Skin: Negative for rash.  Hematological: Negative for adenopathy. Does not bruise/bleed easily.  Psychiatric/Behavioral: Negative for behavioral problems and confusion.    Past Medical History:  Diagnosis Date  . Depression   . Hypertension   . Vertigo      Social History   Socioeconomic History  . Marital status: Married    Spouse name: Not on file  . Number of children: Not on file  . Years of education: Not on file  . Highest education level: Not on file  Social Needs  . Financial resource strain: Not on file  . Food insecurity - worry: Not on file  . Food insecurity - inability: Not on file  . Transportation needs - medical: Not on file  . Transportation needs - non-medical: Not on file  Occupational History  . Not on file  Tobacco Use  . Smoking  status: Never Smoker  . Smokeless tobacco: Never Used  Substance and Sexual Activity  . Alcohol use: No  . Drug use: Not on file  . Sexual activity: Not on file  Other Topics Concern  . Not on file  Social History Narrative  . Not on file    Past Surgical History:  Procedure Laterality Date  . ABDOMINAL HYSTERECTOMY    . EYE SURGERY    . TUBAL LIGATION      No family history on file.  Allergies  Allergen Reactions  . Propoxyphene Nausea And Vomiting    Propoxyphene and Acetaminophen  . Demerol [Meperidine]   . Valium [Diazepam]     Current Outpatient Medications on File Prior to Visit  Medication Sig Dispense Refill  . cholecalciferol (VITAMIN D) 1000 units tablet Take 1,000 Units by mouth daily.    . COD LIVER OIL PO Take 1 capsule by mouth daily.    Marland Kitchen escitalopram (LEXAPRO) 10 MG tablet Take 1 tablet (10 mg total) by mouth every other day. 90 tablet 1  . lisinopril (PRINIVIL,ZESTRIL) 10 MG tablet TAKE 1 TABLET BY MOUTH ONCE DAILY 90 tablet 1  . lovastatin (MEVACOR) 20 MG tablet Take 1 tablet (20 mg total) by mouth at bedtime. 90 tablet 3  . meclizine (ANTIVERT) 25 MG tablet Take 1 tablet (25 mg total) by mouth 3 (three) times daily as  needed for dizziness. 30 tablet 0  . thiamine (VITAMIN B-1) 100 MG tablet Take 100 mg by mouth daily.     No current facility-administered medications on file prior to visit.     BP (!) 142/83   Pulse 63   Temp 98.1 F (36.7 C) (Oral)   Resp 16   Ht 5\' 11"  (1.803 m)   Wt 172 lb 12.8 oz (78.4 kg)   SpO2 100%   BMI 24.10 kg/m       Objective:   Physical Exam  General Mental Status- Alert. General Appearance- Not in acute distress.   Skin General: Color- Normal Color. Moisture- Normal Moisture.  Neck Carotid Arteries- Normal color. Moisture- Normal Moisture. No carotid bruits. No JVD. Turning head to rt side and chin to chest pain rt trapezius.   Chest and Lung Exam Auscultation: Breath  Sounds:-Normal.  Cardiovascular Auscultation:Rythm- Regular. Murmurs & Other Heart Sounds:Auscultation of the heart reveals- No Murmurs.  Abdomen Inspection:-Inspeection Normal. Palpation/Percussion:Note:No mass. Palpation and Percussion of the abdomen reveal- Non Tender, Non Distended + BS, no rebound or guarding.    Neurologic Cranial Nerve exam:- CN III-XII intact(No nystagmus), symmetric smile. Strength:- 5/5 equal and symmetric strength both upper and lower extremities.  Back-(upper region) pain over rt scapula on palpation. No rash seen.   Low back- rt side paraspinal tenderness to palpation. Rt si tender. No pain in cva area directly.     Assessment & Plan:  For your history of right trapezius muscle region pain and scapular pain, I am getting scapula x-ray and prescribing diclofenac along with Flexeril.  Please stop any over-the-counter NSAIDs presently.  You have some pleuritic pain at times and I decided to go ahead and get a chest x-ray.  In addition to the above you have some right paraspinal pain in the lumbar region.  This pain has been only present since Sunday night.  Some sciatic region pain as well.  I do not think this pain represents kidney stone this time.  If your pain becomes worse would get lumbar spine x-ray and expand workup.  Follow-up in 7-10 days or as needed.   Note regarding your 25-month duration of scapular region pain.  If the above does not work then will consider referral to sports medicine and/or PT.  Mackie Pai, PA-C

## 2017-06-17 NOTE — Patient Instructions (Signed)
For your history of right trapezius muscle region pain and scapular pain, I am getting scapula x-ray and prescribing diclofenac along with Flexeril.  Please stop any over-the-counter NSAIDs presently.  You have some pleuritic pain at times and I decided to go ahead and get a chest x-ray.  In addition to the above you have some right paraspinal pain in the lumbar region.  This pain has been only present since Sunday night.  Some sciatic region pain as well.  I do not think this pain represents kidney stone this time.  If your pain becomes worse would get lumbar spine x-ray and expand workup.  Follow-up in 7-10 days or as needed.   Note regarding your 44-month duration of scapular region pain.  If the above does not work then will consider referral to sports medicine and/or PT.

## 2017-06-18 ENCOUNTER — Other Ambulatory Visit: Payer: Self-pay

## 2017-06-18 MED ORDER — CYCLOBENZAPRINE HCL 5 MG PO TABS: 5.0000 mg | ORAL_TABLET | Freq: Three times a day (TID) | ORAL | 0 refills | Status: DC | PRN

## 2017-06-18 MED ORDER — DICLOFENAC POTASSIUM 50 MG PO TABS: 50.0000 mg | ORAL_TABLET | Freq: Two times a day (BID) | ORAL | 0 refills | Status: DC

## 2017-06-18 NOTE — Progress Notes (Signed)
Sent pt medication in to another pharmacy. [facility] is closed for 2 weeks

## 2017-06-22 ENCOUNTER — Encounter (HOSPITAL_BASED_OUTPATIENT_CLINIC_OR_DEPARTMENT_OTHER): Payer: Self-pay | Admitting: *Deleted

## 2017-06-22 ENCOUNTER — Other Ambulatory Visit: Payer: Self-pay

## 2017-06-22 ENCOUNTER — Emergency Department (HOSPITAL_BASED_OUTPATIENT_CLINIC_OR_DEPARTMENT_OTHER): Payer: PPO

## 2017-06-22 ENCOUNTER — Emergency Department (HOSPITAL_BASED_OUTPATIENT_CLINIC_OR_DEPARTMENT_OTHER)
Admission: EM | Admit: 2017-06-22 | Discharge: 2017-06-22 | Disposition: A | Discharge status: Home / Self Care | Payer: PPO | Attending: Emergency Medicine | Admitting: Emergency Medicine

## 2017-06-22 DIAGNOSIS — R05 Cough: Secondary | ICD-10-CM | POA: Diagnosis not present

## 2017-06-22 DIAGNOSIS — Z79899 Other long term (current) drug therapy: Secondary | ICD-10-CM | POA: Insufficient documentation

## 2017-06-22 DIAGNOSIS — F419 Anxiety disorder, unspecified: Secondary | ICD-10-CM | POA: Diagnosis not present

## 2017-06-22 DIAGNOSIS — R0602 Shortness of breath: Secondary | ICD-10-CM | POA: Diagnosis not present

## 2017-06-22 DIAGNOSIS — I10 Essential (primary) hypertension: Secondary | ICD-10-CM | POA: Insufficient documentation

## 2017-06-22 DIAGNOSIS — R0789 Other chest pain: Secondary | ICD-10-CM | POA: Diagnosis not present

## 2017-06-22 DIAGNOSIS — F329 Major depressive disorder, single episode, unspecified: Secondary | ICD-10-CM | POA: Diagnosis not present

## 2017-06-22 DIAGNOSIS — R079 Chest pain, unspecified: Secondary | ICD-10-CM | POA: Diagnosis not present

## 2017-06-22 DIAGNOSIS — Z87891 Personal history of nicotine dependence: Secondary | ICD-10-CM | POA: Diagnosis not present

## 2017-06-22 DIAGNOSIS — R091 Pleurisy: Secondary | ICD-10-CM | POA: Diagnosis not present

## 2017-06-22 HISTORY — DX: Pure hypercholesterolemia, unspecified: E78.00

## 2017-06-22 LAB — CBC WITH DIFFERENTIAL/PLATELET
Basophils Absolute: 0 10*3/uL (ref 0.0–0.1)
Basophils Relative: 0 %
EOS ABS: 0.1 10*3/uL (ref 0.0–0.7)
EOS PCT: 1 %
HCT: 42.7 % (ref 36.0–46.0)
Hemoglobin: 14.2 g/dL (ref 12.0–15.0)
LYMPHS ABS: 2.3 10*3/uL (ref 0.7–4.0)
Lymphocytes Relative: 32 %
MCH: 29.1 pg (ref 26.0–34.0)
MCHC: 33.3 g/dL (ref 30.0–36.0)
MCV: 87.5 fL (ref 78.0–100.0)
Monocytes Absolute: 0.4 10*3/uL (ref 0.1–1.0)
Monocytes Relative: 5 %
Neutro Abs: 4.4 10*3/uL (ref 1.7–7.7)
Neutrophils Relative %: 62 %
PLATELETS: 289 10*3/uL (ref 150–400)
RBC: 4.88 MIL/uL (ref 3.87–5.11)
RDW: 12.3 % (ref 11.5–15.5)
WBC: 7.1 10*3/uL (ref 4.0–10.5)

## 2017-06-22 LAB — BASIC METABOLIC PANEL
Anion gap: 11 (ref 5–15)
BUN: 22 mg/dL — AB (ref 6–20)
CO2: 25 mmol/L (ref 22–32)
CREATININE: 0.66 mg/dL (ref 0.44–1.00)
Calcium: 9.5 mg/dL (ref 8.9–10.3)
Chloride: 104 mmol/L (ref 101–111)
GFR calc Af Amer: >60 mL/min (ref >60)
Glucose, Bld: 91 mg/dL (ref 65–99)
Potassium: 3.6 mmol/L (ref 3.5–5.1)
Sodium: 140 mmol/L (ref 135–145)

## 2017-06-22 LAB — D-DIMER, QUANTITATIVE (NOT AT ARMC): D DIMER QUANT: 0.37 ug{FEU}/mL (ref 0.00–0.50)

## 2017-06-22 LAB — TROPONIN I: Troponin I: <0.03 ng/mL (ref <0.03)

## 2017-06-22 NOTE — ED Triage Notes (Signed)
Pt reports chest pain yesterday "like little needles in my chest". Pain started again today with SOB

## 2017-06-22 NOTE — ED Provider Notes (Signed)
[facility]PART[facility] Provider Note   CSN: [id number] Arrival date & time: 06/22/17  1454     History   Chief Complaint Chief Complaint  Patient presents with  . Chest Pain    HPI Sherry Butler is a 67 y.o. female.  PT with a hx of HTN and hyperlipidemia who presents with left sided chest pain.  She states the pain started yesterday and has been ongoing.  It is a sharp pain to her left chest that only hurts when she is taking a deep breath.  She has some mild SOB.  No n/v or diaphoresis.  No exertional symptoms.  No leg pain or swelling.  No hx of DVT, no recent immobilization.  She has had a cough and cold for about a week, but her symptoms are improving.  No fevers/chills.  She also recently lifted a heavy box of jeans over her head at work and thinks that she may have pulled a muscle.  She has not taken anything for the pain and denies the need for anything currently.      Past Medical History:  Diagnosis Date  . Depression   . High cholesterol   . Hypertension   . Vertigo     Patient Active Problem List   Diagnosis Date Noted  . Left wrist pain 03/28/2017  . HSV-2 (herpes simplex virus 2) infection 10/24/2016  . Bilateral high frequency sensorineural hearing loss 09/04/2016  . OSA on CPAP 08/23/2016  . Essential hypertension 08/23/2016  . Mild depression ([place]) 08/23/2016  . Vertigo 08/23/2016  . Vitamin D deficiency 02/11/2015  . Anxiety 02/11/2015  . Mixed hyperlipidemia 02/11/2015  . Cataract extraction status 11/17/2014  . Osteopenia 11/09/2014  . Calculus of kidney 11/09/2014  . Other seborrheic keratosis 09/30/2014  . Neovascularization, retina 11/05/2013  . Retinal hemorrhage 11/05/2013  . Retinal macular atrophy 11/05/2013  . Progressive high myopia 11/05/2013  . Nonexudative age-related macular degeneration 11/05/2013  . Low back pain 07/20/2013  . Fatty liver 09/04/2012  . GERD (gastroesophageal reflux disease) 03/06/2012     Past Surgical History:  Procedure Laterality Date  . ABDOMINAL HYSTERECTOMY    . EYE SURGERY    . TUBAL LIGATION      OB History    No data available       Home Medications    Prior to Admission medications   Medication Sig Start Date End Date Taking? Authorizing Provider  cholecalciferol (VITAMIN D) 1000 units tablet Take 1,000 Units by mouth daily.    [provider]  COD LIVER OIL PO Take 1 capsule by mouth daily.    [provider]  cyclobenzaprine (FLEXERIL) 5 MG tablet Take 1 tablet (5 mg total) by mouth 3 (three) times daily as needed for muscle spasms. 06/18/17   Saguier, Percell Miller, PA-C  diclofenac (CATAFLAM) 50 MG tablet Take 1 tablet (50 mg total) by mouth 2 (two) times daily. 06/18/17   Saguier, Percell Miller, PA-C  escitalopram (LEXAPRO) 10 MG tablet Take 1 tablet (10 mg total) by mouth every other day. 09/21/16   Copland, Gay Filler, MD  lisinopril (PRINIVIL,ZESTRIL) 10 MG tablet TAKE 1 TABLET BY MOUTH ONCE DAILY 04/18/17   Copland, Gay Filler, MD  lovastatin (MEVACOR) 20 MG tablet Take 1 tablet (20 mg total) by mouth at bedtime. 10/29/16   Copland, Gay Filler, MD  meclizine (ANTIVERT) 25 MG tablet Take 1 tablet (25 mg total) by mouth 3 (three) times daily as needed for dizziness. 03/20/17  Saguier, Percell Miller, PA-C  thiamine (VITAMIN B-1) 100 MG tablet Take 100 mg by mouth daily.    [provider]    Family History No family history on file.  Social History Social History   Tobacco Use  . Smoking status: Former Research scientist (life sciences)  . Smokeless tobacco: Never Used  Substance Use Topics  . Alcohol use: No  . Drug use: Not on file     Allergies   Propoxyphene; Demerol [meperidine]; and Valium [diazepam]   Review of Systems Review of Systems  Constitutional: Negative for chills, diaphoresis, fatigue and fever.  HENT: Negative for congestion, rhinorrhea and sneezing.   Eyes: Negative.   Respiratory: Positive for cough and shortness of breath. Negative for  chest tightness.   Cardiovascular: Positive for chest pain. Negative for leg swelling.  Gastrointestinal: Negative for abdominal pain, blood in stool, diarrhea, nausea and vomiting.  Genitourinary: Negative for difficulty urinating, flank pain, frequency and hematuria.  Musculoskeletal: Negative for arthralgias and back pain.  Skin: Negative for rash.  Neurological: Negative for dizziness, speech difficulty, weakness, numbness and headaches.     Physical Exam Updated Vital Signs BP (!) 167/89   Pulse (!) 57   Temp 98.2 F (36.8 C) (Oral)   Resp 11   Ht 5\' 11"  (1.803 m)   Wt 78 kg (172 lb)   SpO2 99%   BMI 23.99 kg/m   Physical Exam  Constitutional: She is oriented to person, place, and time. She appears well-developed and well-nourished.  HENT:  Head: Normocephalic and atraumatic.  Eyes: Pupils are equal, round, and reactive to light.  Neck: Normal range of motion. Neck supple.  Cardiovascular: Normal rate, regular rhythm and normal heart sounds.  Mild TTP over left chest wall.  Pulmonary/Chest: Effort normal and breath sounds normal. No respiratory distress. She has no wheezes. She has no rales. She exhibits no tenderness.  Abdominal: Soft. Bowel sounds are normal. There is no tenderness. There is no rebound and no guarding.  Musculoskeletal: Normal range of motion. She exhibits no edema.  No edema or calf tenderness  Lymphadenopathy:    She has no cervical adenopathy.  Neurological: She is alert and oriented to person, place, and time.  Skin: Skin is warm and dry. No rash noted.  Psychiatric: She has a normal mood and affect.     ED Treatments / Results  Labs (all labs ordered are listed, but only abnormal results are displayed) Labs Reviewed  BASIC METABOLIC PANEL - Abnormal; Notable for the following components:      Result Value   BUN 22 (*)    All other components within normal limits  CBC WITH DIFFERENTIAL/PLATELET  D-DIMER, QUANTITATIVE (NOT AT [facility])    TROPONIN I    EKG  EKG Interpretation  Date/Time:  Saturday June 22 2017 15:01:21 EST Ventricular Rate:  67 PR Interval:  146 QRS Duration: 84 QT Interval:  400 QTC Calculation: 422 R Axis:   -56 Text Interpretation:  Normal sinus rhythm Left axis deviation Abnormal ECG No old tracing to compare Confirmed by Malvin Johns [id number]) on 06/22/2017 3:06:25 PM       Radiology Dg Chest 2 View  Result Date: 06/22/2017 CLINICAL DATA:  Chest pain since yesterday and shortness of breath today. EXAM: CHEST  2 VIEW COMPARISON:  06/17/2017. FINDINGS: Normal sized heart. Small amount of linear density at the left lung base, mildly increased. Stable tiny amount of linear density at the right lung base. Interval blunting of the posterior costophrenic angles on  the lateral view. The overall lung volumes remain hyperexpanded with mild diffuse peribronchial thickening. Mild thoracic spine degenerative changes. IMPRESSION: 1. Possible interval small bilateral pleural effusions. 2. Mildly increased linear atelectasis or scarring at the left lung base. 3. Stable mild changes of COPD and chronic bronchitis. Electronically Signed   By: Claudie Revering M.D.   On: 06/22/2017 16:33    Procedures Procedures (including critical care time)  Medications Ordered in ED Medications - No data to display   Initial Impression / Assessment and Plan / ED Course  I have reviewed the triage vital signs and the nursing notes.  Pertinent labs & imaging results that were available during my care of the patient were reviewed by me and considered in my medical decision making (see chart for details).     PT presents with sharp left chest pain, worse on inspiration.  The d-dimer is neg and there are no other suggestions of PE.  No signs of pneumonia or PTX on CXR.  No ischemic changes on EKG, troponin negative, does not sound like ACS.  Symptoms are likely pleurisy related to her recent URI.  Advised her in symptomatic care.   Advised to f/u with her PCP.  Return precautions given.  Final Clinical Impressions(s) / ED Diagnoses   Final diagnoses:  Pleurisy    ED Discharge Orders    None       Malvin Johns, MD 06/22/17 1655

## 2017-07-21 ENCOUNTER — Emergency Department (HOSPITAL_BASED_OUTPATIENT_CLINIC_OR_DEPARTMENT_OTHER)
Admission: EM | Admit: 2017-07-21 | Discharge: 2017-07-21 | Disposition: A | Discharge status: Home / Self Care | Payer: PPO | Attending: Emergency Medicine | Admitting: Emergency Medicine

## 2017-07-21 ENCOUNTER — Encounter (HOSPITAL_BASED_OUTPATIENT_CLINIC_OR_DEPARTMENT_OTHER): Payer: Self-pay | Admitting: Adult Health

## 2017-07-21 ENCOUNTER — Emergency Department (HOSPITAL_BASED_OUTPATIENT_CLINIC_OR_DEPARTMENT_OTHER): Payer: PPO

## 2017-07-21 ENCOUNTER — Other Ambulatory Visit: Payer: Self-pay

## 2017-07-21 DIAGNOSIS — I10 Essential (primary) hypertension: Secondary | ICD-10-CM | POA: Diagnosis not present

## 2017-07-21 DIAGNOSIS — Z79899 Other long term (current) drug therapy: Secondary | ICD-10-CM | POA: Diagnosis not present

## 2017-07-21 DIAGNOSIS — Z87891 Personal history of nicotine dependence: Secondary | ICD-10-CM | POA: Insufficient documentation

## 2017-07-21 DIAGNOSIS — R05 Cough: Secondary | ICD-10-CM | POA: Insufficient documentation

## 2017-07-21 DIAGNOSIS — R059 Cough, unspecified: Secondary | ICD-10-CM

## 2017-07-21 DIAGNOSIS — H6123 Impacted cerumen, bilateral: Secondary | ICD-10-CM | POA: Diagnosis not present

## 2017-07-21 MED ORDER — CARBAMIDE PEROXIDE 6.5 % OT SOLN: 5.0000 [drp] | Freq: Two times a day (BID) | OTIC | 0 refills | Status: DC

## 2017-07-21 MED ORDER — GUAIFENESIN-CODEINE 100-10 MG/5ML PO SOLN: 5.0000 mL | Freq: Every evening | ORAL | 0 refills | Status: DC | PRN

## 2017-07-21 MED ORDER — BENZONATATE 100 MG PO CAPS: 100.0000 mg | ORAL_CAPSULE | Freq: Three times a day (TID) | ORAL | 0 refills | Status: DC

## 2017-07-21 NOTE — Discharge Instructions (Signed)
Please use Tessalon Perles for cough during the day and night you may use guaifenesin codeine syrup to help with cough suppression for sleep, you may also use throat lozenges as needed, please follow-up with your primary care doctor if this cough is not improving, your chest x-ray today shows no evidence of pneumonia.  Return to ED if you develop chest pain or shortness of breath, or begin having high fevers with productive cough.  Please begin using Debrox drops daily to help reduce earwax buildup, no evidence of ear infection today.  If you develop ear pain, or drainage please follow-up with your primary care doctor.

## 2017-07-21 NOTE — ED Provider Notes (Signed)
[facility] Provider Note   CSN: [id number] Arrival date & time: 07/21/17  1317     History   Chief Complaint Chief Complaint  Patient presents with  . Cough    HPI Sherry Butler is a 67 y.o. female.  Sherry Butler is a 67 y.o. Female with history of HTN and HLD, who presents to the ED for evaluation of 1 week of persistent dry cough. Pt reports frequent coughing, worse at night, non-productive, no hemoptysis. Pt was seen a few weeks ago with URI, reports of sx but cough completely resolved. Pt has been using cough drops and corcidin w/o relief. She reports chest soreness from coughing and SOB during a coughing spell, but no CP or SOB outside of coughing. No fevers or chills, rhinorrhea, sore throat, N/V/D, or abdominal pain. Pt also reports R ear pain over the past 2-3 days, no hearing change or drainage. History of vertigo, which pt reports has been acting up recently with ear pain.      Past Medical History:  Diagnosis Date  . Depression   . High cholesterol   . Hypertension   . Vertigo     Patient Active Problem List   Diagnosis Date Noted  . Left wrist pain 03/28/2017  . HSV-2 (herpes simplex virus 2) infection 10/24/2016  . Bilateral high frequency sensorineural hearing loss 09/04/2016  . OSA on CPAP 08/23/2016  . Essential hypertension 08/23/2016  . Mild depression ([place]) 08/23/2016  . Vertigo 08/23/2016  . Vitamin D deficiency 02/11/2015  . Anxiety 02/11/2015  . Mixed hyperlipidemia 02/11/2015  . Cataract extraction status 11/17/2014  . Osteopenia 11/09/2014  . Calculus of kidney 11/09/2014  . Other seborrheic keratosis 09/30/2014  . Neovascularization, retina 11/05/2013  . Retinal hemorrhage 11/05/2013  . Retinal macular atrophy 11/05/2013  . Progressive high myopia 11/05/2013  . Nonexudative age-related macular degeneration 11/05/2013  . Low back pain 07/20/2013  . Fatty liver 09/04/2012  . GERD (gastroesophageal  reflux disease) 03/06/2012    Past Surgical History:  Procedure Laterality Date  . ABDOMINAL HYSTERECTOMY    . EYE SURGERY    . TUBAL LIGATION      OB History    No data available       Home Medications    Prior to Admission medications   Medication Sig Start Date End Date Taking? Authorizing Provider  benzonatate (TESSALON) 100 MG capsule Take 1 capsule (100 mg total) by mouth every 8 (eight) hours. 07/21/17   Jacqlyn Larsen, PA-C  carbamide peroxide (DEBROX) 6.5 % OTIC solution Place 5 drops into both ears 2 (two) times daily. 07/21/17   Jacqlyn Larsen, PA-C  cholecalciferol (VITAMIN D) 1000 units tablet Take 1,000 Units by mouth daily.    [provider]  COD LIVER OIL PO Take 1 capsule by mouth daily.    [provider]  cyclobenzaprine (FLEXERIL) 5 MG tablet Take 1 tablet (5 mg total) by mouth 3 (three) times daily as needed for muscle spasms. 06/18/17   Saguier, Percell Miller, PA-C  diclofenac (CATAFLAM) 50 MG tablet Take 1 tablet (50 mg total) by mouth 2 (two) times daily. 06/18/17   Saguier, Percell Miller, PA-C  escitalopram (LEXAPRO) 10 MG tablet Take 1 tablet (10 mg total) by mouth every other day. 09/21/16   Copland, Gay Filler, MD  guaiFENesin-codeine 100-10 MG/5ML syrup Take 5 mLs by mouth at bedtime as needed for cough. 07/21/17   Jacqlyn Larsen, PA-C  lisinopril (PRINIVIL,ZESTRIL) 10 MG tablet TAKE  1 TABLET BY MOUTH ONCE DAILY 04/18/17   Copland, Gay Filler, MD  lovastatin (MEVACOR) 20 MG tablet Take 1 tablet (20 mg total) by mouth at bedtime. 10/29/16   Copland, Gay Filler, MD  meclizine (ANTIVERT) 25 MG tablet Take 1 tablet (25 mg total) by mouth 3 (three) times daily as needed for dizziness. 03/20/17   Saguier, Percell Miller, PA-C  thiamine (VITAMIN B-1) 100 MG tablet Take 100 mg by mouth daily.    [provider]    Family History History reviewed. No pertinent family history.  Social History Social History   Tobacco Use  . Smoking status: Former Research scientist (life sciences)  .  Smokeless tobacco: Never Used  Substance Use Topics  . Alcohol use: No  . Drug use: Not on file     Allergies   Propoxyphene; Demerol [meperidine]; and Valium [diazepam]   Review of Systems Review of Systems  Constitutional: Negative for chills and fever.  HENT: Positive for ear pain. Negative for congestion, ear discharge, hearing loss, postnasal drip, rhinorrhea, sinus pain, sneezing and sore throat.   Respiratory: Positive for cough. Negative for chest tightness, shortness of breath and wheezing.   Cardiovascular: Negative for chest pain.  Gastrointestinal: Negative for abdominal pain, diarrhea, nausea and vomiting.  Genitourinary: Negative for dysuria.  Musculoskeletal: Positive for myalgias. Negative for arthralgias, back pain, neck pain and neck stiffness.  Skin: Negative for color change, pallor and rash.  Neurological: Negative for headaches.     Physical Exam Updated Vital Signs BP (!) 153/88 (BP Location: Right Arm)   Pulse 62   Temp 97.8 F (36.6 C) (Oral)   Resp 16   Ht 5\' 10"  (1.778 m)   Wt 77.1 kg (170 lb)   SpO2 98%   BMI 24.39 kg/m   Physical Exam  Constitutional: She appears well-developed and well-nourished. No distress.  HENT:  Head: Normocephalic and atraumatic.  Bilateral ears with cerumen impaction, TMs completely occluded by dried wax, mild nasal mucosa edema with clear rhinorrhea, posterior oropharynx clear and moist, with no erythema, edema or exudates, uvula midline  Eyes: Right eye exhibits no discharge. Left eye exhibits no discharge.  Neck: Neck supple.  Cardiovascular: Normal rate, regular rhythm, normal heart sounds and intact distal pulses.  Pulmonary/Chest: Effort normal and breath sounds normal. No stridor. No respiratory distress. She has no wheezes. She has no rales.  Respirations equal and unlabored, patient able to speak in full sentences, lungs clear to auscultation bilaterally, mild tenderness with palpation of chest, soreness  from coughing  Abdominal: Soft. Bowel sounds are normal. She exhibits no distension. There is no tenderness.  Lymphadenopathy:    She has no cervical adenopathy.  Neurological: She is alert. Coordination normal.  Skin: Skin is warm and dry. Capillary refill takes less than 2 seconds. She is not diaphoretic.  Psychiatric: She has a normal mood and affect. Her behavior is normal.  Nursing note and vitals reviewed.    ED Treatments / Results  Labs (all labs ordered are listed, but only abnormal results are displayed) Labs Reviewed - No data to display  EKG  EKG Interpretation None       Radiology Dg Chest 2 View  Result Date: 07/21/2017 CLINICAL DATA:  Nonproductive cough, fever for 1 week EXAM: CHEST  2 VIEW COMPARISON:  None. FINDINGS: The heart size and mediastinal contours are within normal limits. Both lungs are clear. The visualized skeletal structures are unremarkable. IMPRESSION: No active cardiopulmonary disease. Electronically Signed   By: Kathreen Devoid  On: 07/21/2017 13:54    Procedures .Ear Cerumen Removal Date/Time: 07/21/2017 4:14 PM Performed by: Jacqlyn Larsen, PA-C Authorized by: Jacqlyn Larsen, PA-C   Consent:    Consent obtained:  Verbal   Consent given by:  Patient   Risks discussed:  Bleeding, infection, pain and TM perforation   Alternatives discussed:  No treatment Procedure details:    Location:  L ear and R ear   Procedure type: curette   Post-procedure details:    Inspection:  TM intact   Hearing quality:  Improved   Patient tolerance of procedure:  Tolerated well, no immediate complications   (including critical care time)  Medications Ordered in ED Medications - No data to display   Initial Impression / Assessment and Plan / ED Course  I have reviewed the triage vital signs and the nursing notes.  Pertinent labs & imaging results that were available during my care of the patient were reviewed by me and considered in my medical decision  making (see chart for details).  Pt presents for evaluation of 1 week of dry cough, as well as some right ear pain. Chest soreness from coughing, but no CP or SOB. Pt well-appearing with normal vitals, aside from HTN, which improved w/o intervention. Lungs clear on exam, and CXR shows no evidence of pneumonia, likely lingering cough after URI, will treat with tessalon during the day, and guaifenesin to help suppress cough for sleep.  Pt with bilateral cerumen impactions on exam, likely causing ear pain. Cerumen removed with hydrogen peroxide and curette, bilateral TMs clear, no evidence of otitis. Will  Prescribe debrox drops, pt with hx of vertigo and this may have been worsening it. Pt to follow up with her PCP for recheck of ears and cough. Return precautions discussed. Pt expresses understanding and agrees with plan. Pt in NAD at DC.   Patient discussed with Dr. Tamera Punt, who saw patient as well and agrees with plan.  Final Clinical Impressions(s) / ED Diagnoses   Final diagnoses:  Cough  Bilateral impacted cerumen    ED Discharge Orders        Ordered    carbamide peroxide (DEBROX) 6.5 % OTIC solution  2 times daily     07/21/17 1645    benzonatate (TESSALON) 100 MG capsule  Every 8 hours     07/21/17 1645    guaiFENesin-codeine 100-10 MG/5ML syrup  At bedtime PRN     07/21/17 1645       Jacqlyn Larsen, PA-C 07/22/17 0001    Malvin Johns, MD 07/22/17 [phone]

## 2017-07-21 NOTE — ED Triage Notes (Signed)
PResents with cough and ear pain that began a few weeks ago and is now making her chest sore. SHe was here for same a ffew weeks ago. Breath sounds clear. Denies productive cough. Endorses fatigue

## 2017-09-14 NOTE — Progress Notes (Addendum)
[facility] [address] [place], [place] [address] [phone]  Date:  09/16/2017   Name:  Sherry Butler   DOB:  [birth year]   MRN:  [id number]  PCP:  Darreld Mclean, MD    Chief Complaint: Bladder Issues (unable to urinat if not in a certain position, no uti symtpoms, 2 weeks)   History of Present Illness:  Sherry Butler is a 67 y.o. very pleasant female patient who presents with the following:  Here today with concern of urinary sx for a couple of weeks. She feels like it is harder to get her urine out- she will have a dribbly stream, and she has to change position to pass her urine No pain  She notes urinary urgency, and she is having urinary incontinence in the am most days - however this is not new.  She has had a bladder lift operation in the past to address this issue No blood in her urine Some occasional back pain but nothing new.  No persistent belly pain.  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 She works at [employer]  She had some trouble with her eyes and was out of the work force for a few years- but is happy to be back at work, likes to have something to do and to make extra money. She also enjoys the exercise she gets walking around the store She has 4 children 47, 45, 41, 40 She has 17 grandchildren! Her children all live up [place].  She is originally from [place].  She met her husband in [place]- they then moved [facility].  After her divorce she moved here to be  closer to her sister, and met her current husband (they have been married for 6 years).    Pneumonia: she declines this today Tetanus: declines today Colon: never had, she would like to do cologuard  She had trouble with pleurisy and cough and has been seen in the ER a couple of times this year  She has been under stress at work, notes that she maybe craving sweets and has gained some weight just recently.  She is worried about her blood sugar and would like me to check this for her  Wt Readings from Last 3 Encounters:  09/16/17 177 lb (80.3 kg)  07/21/17 170 lb (77.1 kg)  06/22/17 172 lb (78 kg)    Patient Active Problem List   Diagnosis Date Noted  . Left wrist pain 03/28/2017  . HSV-2 (herpes simplex virus 2) infection 10/24/2016  . Bilateral high frequency sensorineural hearing loss 09/04/2016  . OSA on CPAP 08/23/2016  . Essential hypertension 08/23/2016  . Mild depression ([place]) 08/23/2016  . Vertigo 08/23/2016  . Vitamin D deficiency 02/11/2015  . Anxiety 02/11/2015  . Mixed hyperlipidemia 02/11/2015  . Cataract extraction status 11/17/2014  . Osteopenia 11/09/2014  . Calculus of kidney 11/09/2014  . Other seborrheic keratosis 09/30/2014  . Neovascularization, retina 11/05/2013  .  Retinal hemorrhage 11/05/2013  . Retinal macular atrophy 11/05/2013  . Progressive high myopia 11/05/2013  . Nonexudative age-related macular degeneration 11/05/2013  . Low back pain 07/20/2013  . Fatty liver 09/04/2012  . GERD (gastroesophageal reflux disease) 03/06/2012    Past Medical History:  Diagnosis Date  . Depression   . High cholesterol   . Hypertension   . Vertigo     Past Surgical History:  Procedure Laterality Date  . ABDOMINAL HYSTERECTOMY    . EYE SURGERY    . TUBAL LIGATION      Social History   Tobacco Use  . Smoking status: Former Research scientist (life sciences)  . Smokeless tobacco: Never Used  Substance Use Topics  . Alcohol use: No  . Drug use: Not on file    No family  history on file.  Allergies  Allergen Reactions  . Propoxyphene Nausea And Vomiting    Propoxyphene and Acetaminophen  . Demerol  [Meperidine Hcl] Nausea And Vomiting  . Demerol [Meperidine]   . Valium [Diazepam]     Medication list has been reviewed and updated.  Current Outpatient Medications on File Prior to Visit  Medication Sig Dispense Refill  . benzonatate (TESSALON) 100 MG capsule Take 1 capsule (100 mg total) by mouth every 8 (eight) hours. 21 capsule 0  . carbamide peroxide (DEBROX) 6.5 % OTIC solution Place 5 drops into both ears 2 (two) times daily. 15 mL 0  . cholecalciferol (VITAMIN D) 1000 units tablet Take 1,000 Units by mouth daily.    . COD LIVER OIL PO Take 1 capsule by mouth daily.    . cyclobenzaprine (FLEXERIL) 5 MG tablet Take 1 tablet (5 mg total) by mouth 3 (three) times daily as needed for muscle spasms. 10 tablet 0  . diclofenac (CATAFLAM) 50 MG tablet Take 1 tablet (50 mg total) by mouth 2 (two) times daily. 20 tablet 0  . escitalopram (LEXAPRO) 10 MG tablet Take 1 tablet (10 mg total) by mouth every other day. 90 tablet 1  . guaiFENesin-codeine 100-10 MG/5ML syrup Take 5 mLs by mouth at bedtime as needed for cough. 120 mL 0  . lisinopril (PRINIVIL,ZESTRIL) 10 MG tablet TAKE 1 TABLET BY MOUTH ONCE DAILY 90 tablet 1  . lovastatin (MEVACOR) 20 MG tablet Take 1 tablet (20 mg total) by mouth at bedtime. 90 tablet 3  . meclizine (ANTIVERT) 25 MG tablet Take 1 tablet (25 mg total) by mouth 3 (three) times daily as needed for dizziness. 30 tablet 0  . thiamine (VITAMIN B-1) 100 MG tablet Take 100 mg by mouth daily.     No current facility-administered medications on file prior to visit.     Review of Systems:  As per HPI- otherwise negative.   Physical Examination: Vitals:   09/16/17 0915  BP: (!) 143/81  Pulse: 66  Resp: 16  SpO2: 97%   Vitals:   09/16/17 0915  Weight: 177 lb (80.3 kg)  Height: _0  (1.778 m)   Body mass index is 25.4  kg/m. Ideal Body Weight: Weight in (lb) to have BMI = 25: 173.9  GEN: WDWN, NAD, Non-toxic, A & O x 3, looks well, normal weight HEENT: Atraumatic, Normocephalic. Neck supple. No masses, No LAD.  Bilateral TM wnl, oropharynx normal.  PEERL,EOMI.   Ears and Nose: No external deformity. CV: RRR, No M/G/R. No JVD. No thrill. No extra heart sounds. PULM: CTA B, no wheezes, crackles, rhonchi. No retractions. No resp. distress. No accessory muscle use. ABD: S, NT, ND.  No rebound. No HSM. EXTR: No c/c/e NEURO Normal gait.  PSYCH: Normally interactive. Conversant. Not depressed or anxious appearing.  Calm demeanor.  Normal external urethra today   Assessment and Plan: Weak urine stream - Plan: Urine Culture, POCT urinalysis dipstick, US Pelvis Limited  Screening for colon cancer  Weight gain  Abdominal pain, unspecified abdominal location - Plan: CBC, Comprehensive metabolic panel  Here today with concern about her urine stream UA is negative.   Will set up a PVR [place] for her  Await her other labs cologuard ordered today  Signed Lamar Blinks, MD  Received her labs so far and PVR [place] - called her to discuss.  So far all looks ok, PVR is 52 ml which is ok.  Await urine culture and will be back in touch with her/  She states understanding and agreement   Received urine culture 5/2:  Also negative Called pt - will refer to urology.  She states agreement and understanding  Results for orders placed or performed in visit on 09/16/17  Urine Culture  Result Value Ref Range   MICRO NUMBER: [id number]    SPECIMEN QUALITY: ADEQUATE    Sample Source URINE    STATUS: FINAL    Result: No Growth   CBC  Result Value Ref Range   WBC 5.6 4.0 - 10.5 K/uL   RBC 4.68 3.87 - 5.11 Mil/uL   Platelets 287.0 150.0 - 400.0 K/uL   Hemoglobin 14.0 12.0 - 15.0 g/dL   HCT 41.5 36.0 - 46.0 %   MCV 88.7 78.0 - 100.0 fl   MCHC 33.8 30.0 - 36.0 g/dL   RDW 13.0 11.5 - 15.5 %  Comprehensive metabolic panel   Result Value Ref Range   Sodium 144 135 - 145 mEq/L   Potassium 4.7 3.5 - 5.1 mEq/L   Chloride 106 96 - 112 mEq/L   CO2 32 19 - 32 mEq/L   Glucose, Bld 81 70 - 99 mg/dL   BUN 21 6 - 23 mg/dL   Creatinine, Ser 0.71 0.40 - 1.20 mg/dL   Total Bilirubin 0.5 0.2 - 1.2 mg/dL   Alkaline Phosphatase 88 39 - 117 U/L   AST 18 0 - 37 U/L   ALT 16 0 - 35 U/L   Total Protein 7.3 6.0 - 8.3 g/dL   Albumin 4.3 3.5 - 5.2 g/dL   Calcium 9.9 8.4 - 10.5 mg/dL   GFR 87.25 >60.00 mL/min  POCT urinalysis dipstick  Result Value Ref Range   Color, UA yellow yellow   Clarity, UA clear clear   Glucose, UA negative negative mg/dL   Bilirubin, UA negative negative   Ketones, POC UA negative negative mg/dL   Spec Grav, UA >=1.030 (A) 1.010 - 1.025   Blood, UA negative negative   pH, UA 6.0 5.0 - 8.0   Protein Ur, POC negative negative mg/dL   Urobilinogen, UA 0.2 0.2 or 1.0 E.U./dL   Nitrite, UA Negative Negative   Leukocytes, UA Negative Negative    US Pelvis Limited  Result Date: 09/16/2017 CLINICAL DATA:  Weak urine stream EXAM: LIMITED ULTRASOUND OF PELVIS TECHNIQUE: Limited transabdominal ultrasound examination of the pelvis was performed. COMPARISON:  None. FINDINGS: No bladder calculi. No bladder mass. Prevoid bladder volume 131 mL. Postvoid bladder volume 52 mL. IMPRESSION: Prevoid bladder volume 131 mL with 52 mL postvoid residual. Electronically Signed   By: Kathreen Devoid   On: 09/16/2017 16:00   This measurement is made either by straight catheterization  or by bladder ultrasound. Small portable ultrasounds specifically for postvoid residual measurement are available. A normal patient should have the ability to void at least 80 percent of the total bladder volume and have residual urine less than 50 cc immediately after voiding. A high residual urine on repeated determinations indicates outlet obstruction or poor detrusor contractility

## 2017-09-16 ENCOUNTER — Ambulatory Visit (INDEPENDENT_AMBULATORY_CARE_PROVIDER_SITE_OTHER): Payer: PPO | Admitting: Family Medicine

## 2017-09-16 ENCOUNTER — Ambulatory Visit (HOSPITAL_BASED_OUTPATIENT_CLINIC_OR_DEPARTMENT_OTHER)
Admission: RE | Admit: 2017-09-16 | Discharge: 2017-09-16 | Disposition: A | Discharge status: Home / Self Care | Payer: PPO | Source: Ambulatory Visit | Attending: Family Medicine | Admitting: Family Medicine

## 2017-09-16 ENCOUNTER — Encounter: Payer: Self-pay | Admitting: Family Medicine

## 2017-09-16 VITALS — BP 143/81 | HR 66 | Resp 16 | Ht 70.0 in | Wt 177.0 lb

## 2017-09-16 DIAGNOSIS — Z1211 Encounter for screening for malignant neoplasm of colon: Secondary | ICD-10-CM | POA: Diagnosis not present

## 2017-09-16 DIAGNOSIS — R3912 Poor urinary stream: Secondary | ICD-10-CM | POA: Diagnosis not present

## 2017-09-16 DIAGNOSIS — R109 Unspecified abdominal pain: Secondary | ICD-10-CM | POA: Diagnosis not present

## 2017-09-16 DIAGNOSIS — R635 Abnormal weight gain: Secondary | ICD-10-CM

## 2017-09-16 LAB — POCT URINALYSIS DIP (MANUAL ENTRY)
BILIRUBIN UA: NEGATIVE
Blood, UA: NEGATIVE
GLUCOSE UA: NEGATIVE mg/dL
Ketones, POC UA: NEGATIVE mg/dL
LEUKOCYTES UA: NEGATIVE
NITRITE UA: NEGATIVE
Protein Ur, POC: NEGATIVE mg/dL
Spec Grav, UA: >=1.03 — AB (ref 1.010–1.025)
Urobilinogen, UA: 0.2 E.U./dL
pH, UA: 6 (ref 5.0–8.0)

## 2017-09-16 LAB — CBC
HEMATOCRIT: 41.5 % (ref 36.0–46.0)
HEMOGLOBIN: 14 g/dL (ref 12.0–15.0)
MCHC: 33.8 g/dL (ref 30.0–36.0)
MCV: 88.7 fl (ref 78.0–100.0)
PLATELETS: 287 10*3/uL (ref 150.0–400.0)
RBC: 4.68 Mil/uL (ref 3.87–5.11)
RDW: 13 % (ref 11.5–15.5)
WBC: 5.6 10*3/uL (ref 4.0–10.5)

## 2017-09-16 LAB — COMPREHENSIVE METABOLIC PANEL
ALBUMIN: 4.3 g/dL (ref 3.5–5.2)
ALT: 16 U/L (ref 0–35)
AST: 18 U/L (ref 0–37)
Alkaline Phosphatase: 88 U/L (ref 39–117)
BUN: 21 mg/dL (ref 6–23)
CALCIUM: 9.9 mg/dL (ref 8.4–10.5)
CHLORIDE: 106 meq/L (ref 96–112)
CO2: 32 mEq/L (ref 19–32)
CREATININE: 0.71 mg/dL (ref 0.40–1.20)
GFR: 87.25 mL/min (ref >60.00)
Glucose, Bld: 81 mg/dL (ref 70–99)
POTASSIUM: 4.7 meq/L (ref 3.5–5.1)
Sodium: 144 mEq/L (ref 135–145)
Total Bilirubin: 0.5 mg/dL (ref 0.2–1.2)
Total Protein: 7.3 g/dL (ref 6.0–8.3)

## 2017-09-16 NOTE — Patient Instructions (Addendum)
We are going to check for any sign of a urine infection and will also get labs for you today   I have also ordered a cologuard test for you -this is an at home test to screen for colon cancer   You will have an ultrasound to make sure your bladder is emptying out ok- please go to the imaging services suite on the ground floor and they will do this for you.  We do need you to drink about 32 ounces of water beforehand!   After your ultrasound you can go home, and I will be in touch with your results asap

## 2017-09-17 LAB — URINE CULTURE
MICRO NUMBER:: 90518821
RESULT: NO GROWTH
SPECIMEN QUALITY: ADEQUATE

## 2017-09-19 NOTE — Addendum Note (Signed)
Addended by: Lamar Blinks C on: 09/19/2017 08:53 AM   Modules accepted: Orders

## 2017-09-27 ENCOUNTER — Other Ambulatory Visit: Payer: Self-pay | Admitting: Family Medicine

## 2017-09-30 ENCOUNTER — Encounter: Payer: Self-pay | Admitting: Family Medicine

## 2017-09-30 DIAGNOSIS — Z1212 Encounter for screening for malignant neoplasm of rectum: Secondary | ICD-10-CM | POA: Diagnosis not present

## 2017-09-30 DIAGNOSIS — Z1211 Encounter for screening for malignant neoplasm of colon: Secondary | ICD-10-CM | POA: Diagnosis not present

## 2017-10-08 ENCOUNTER — Emergency Department (HOSPITAL_BASED_OUTPATIENT_CLINIC_OR_DEPARTMENT_OTHER): Payer: Worker's Compensation

## 2017-10-08 ENCOUNTER — Other Ambulatory Visit: Payer: Self-pay

## 2017-10-08 ENCOUNTER — Emergency Department (HOSPITAL_BASED_OUTPATIENT_CLINIC_OR_DEPARTMENT_OTHER)
Admission: EM | Admit: 2017-10-08 | Discharge: 2017-10-08 | Disposition: A | Discharge status: Home / Self Care | Payer: Worker's Compensation | Attending: Emergency Medicine | Admitting: Emergency Medicine

## 2017-10-08 ENCOUNTER — Encounter (HOSPITAL_BASED_OUTPATIENT_CLINIC_OR_DEPARTMENT_OTHER): Payer: Self-pay | Admitting: *Deleted

## 2017-10-08 DIAGNOSIS — Z79899 Other long term (current) drug therapy: Secondary | ICD-10-CM | POA: Insufficient documentation

## 2017-10-08 DIAGNOSIS — Z87891 Personal history of nicotine dependence: Secondary | ICD-10-CM | POA: Insufficient documentation

## 2017-10-08 DIAGNOSIS — I10 Essential (primary) hypertension: Secondary | ICD-10-CM | POA: Insufficient documentation

## 2017-10-08 DIAGNOSIS — W228XXA Striking against or struck by other objects, initial encounter: Secondary | ICD-10-CM | POA: Insufficient documentation

## 2017-10-08 DIAGNOSIS — Y929 Unspecified place or not applicable: Secondary | ICD-10-CM | POA: Insufficient documentation

## 2017-10-08 DIAGNOSIS — F329 Major depressive disorder, single episode, unspecified: Secondary | ICD-10-CM | POA: Insufficient documentation

## 2017-10-08 DIAGNOSIS — Y990 Civilian activity done for income or pay: Secondary | ICD-10-CM | POA: Diagnosis not present

## 2017-10-08 DIAGNOSIS — S0990XA Unspecified injury of head, initial encounter: Secondary | ICD-10-CM | POA: Diagnosis not present

## 2017-10-08 DIAGNOSIS — Y9389 Activity, other specified: Secondary | ICD-10-CM | POA: Insufficient documentation

## 2017-10-08 NOTE — ED Notes (Signed)
Patient transported to CT 

## 2017-10-08 NOTE — ED Provider Notes (Signed)
[facility] Provider Note   CSN: [id number] Arrival date & time: 10/08/17  1347     History   Chief Complaint Chief Complaint  Patient presents with  . Head Injury    HPI Sherry Butler is a 67 y.o. female.  67 year old female injured at work where she struck her head on a corner of a metal cabinet.  She said there was initial 20 out of 10 pain.  It was associated with some unbalanced numbness when she ambulates and some blurry vision in her left eye.  The pain is somewhat improved and the vision symptoms somewhat improved although she still feels slightly imbalanced.  She is moderate headache.  Work and told to come here  to be checked out.  There is been no vomiting no nausea no double vision no neck pain no weakness no numbness.  No syncope.  The history is provided by the patient.  Head Injury   The incident occurred 1 to 2 hours ago. She came to the ER via walk-in. The injury mechanism was a direct blow. There was no loss of consciousness. There was no blood loss. The quality of the pain is described as throbbing. The pain is at a severity of 10/10. The pain has been improving since the injury. Associated symptoms include blurred vision and weakness. Pertinent negatives include no numbness, no vomiting, no tinnitus, no disorientation and no memory loss. She has tried nothing for the symptoms. The treatment provided mild relief.    Past Medical History:  Diagnosis Date  . Depression   . High cholesterol   . Hypertension   . Vertigo     Patient Active Problem List   Diagnosis Date Noted  . Left wrist pain 03/28/2017  . HSV-2 (herpes simplex virus 2) infection 10/24/2016  . Bilateral high frequency sensorineural hearing loss 09/04/2016  . OSA on CPAP 08/23/2016  . Essential hypertension 08/23/2016  . Mild depression ([place]) 08/23/2016  . Vertigo 08/23/2016  . Vitamin D deficiency 02/11/2015  . Anxiety 02/11/2015  . Mixed hyperlipidemia  02/11/2015  . Cataract extraction status 11/17/2014  . Osteopenia 11/09/2014  . Calculus of kidney 11/09/2014  . Other seborrheic keratosis 09/30/2014  . Neovascularization, retina 11/05/2013  . Retinal hemorrhage 11/05/2013  . Retinal macular atrophy 11/05/2013  . Progressive high myopia 11/05/2013  . Nonexudative age-related macular degeneration 11/05/2013  . Low back pain 07/20/2013  . Fatty liver 09/04/2012  . GERD (gastroesophageal reflux disease) 03/06/2012    Past Surgical History:  Procedure Laterality Date  . ABDOMINAL HYSTERECTOMY    . EYE SURGERY    . TUBAL LIGATION       OB History   None      Home Medications    Prior to Admission medications   Medication Sig Start Date End Date Taking? Authorizing Provider  benzonatate (TESSALON) 100 MG capsule Take 1 capsule (100 mg total) by mouth every 8 (eight) hours. 07/21/17   Jacqlyn Larsen, PA-C  carbamide peroxide (DEBROX) 6.5 % OTIC solution Place 5 drops into both ears 2 (two) times daily. 07/21/17   Jacqlyn Larsen, PA-C  cholecalciferol (VITAMIN D) 1000 units tablet Take 1,000 Units by mouth daily.    [provider]  COD LIVER OIL PO Take 1 capsule by mouth daily.    [provider]  cyclobenzaprine (FLEXERIL) 5 MG tablet Take 1 tablet (5 mg total) by mouth 3 (three) times daily as needed for muscle spasms. 06/18/17   Saguier,  Percell Miller, PA-C  diclofenac (CATAFLAM) 50 MG tablet Take 1 tablet (50 mg total) by mouth 2 (two) times daily. 06/18/17   Saguier, Percell Miller, PA-C  escitalopram (LEXAPRO) 10 MG tablet TAKE 1 TABLET BY MOUTH EVERY OTHER DAY 09/27/17   Copland, Gay Filler, MD  guaiFENesin-codeine 100-10 MG/5ML syrup Take 5 mLs by mouth at bedtime as needed for cough. 07/21/17   Jacqlyn Larsen, PA-C  lisinopril (PRINIVIL,ZESTRIL) 10 MG tablet TAKE 1 TABLET BY MOUTH ONCE DAILY 09/27/17   Copland, Gay Filler, MD  lovastatin (MEVACOR) 20 MG tablet Take 1 tablet (20 mg total) by mouth at bedtime. 10/29/16   Copland,  Gay Filler, MD  meclizine (ANTIVERT) 25 MG tablet Take 1 tablet (25 mg total) by mouth 3 (three) times daily as needed for dizziness. 03/20/17   Saguier, Percell Miller, PA-C  thiamine (VITAMIN B-1) 100 MG tablet Take 100 mg by mouth daily.    [provider]    Family History No family history on file.  Social History Social History   Tobacco Use  . Smoking status: Former Research scientist (life sciences)  . Smokeless tobacco: Never Used  Substance Use Topics  . Alcohol use: No  . Drug use: Not on file     Allergies   Propoxyphene; Demerol  [meperidine hcl]; Demerol [meperidine]; and Valium [diazepam]   Review of Systems Review of Systems  Constitutional: Negative for fever.  HENT: Negative for sore throat and tinnitus.   Eyes: Positive for blurred vision and visual disturbance. Negative for pain.  Respiratory: Negative for shortness of breath.   Cardiovascular: Negative for chest pain.  Gastrointestinal: Negative for abdominal pain and vomiting.  Genitourinary: Negative for dysuria.  Musculoskeletal: Negative for back pain and neck pain.  Skin: Negative for rash.  Neurological: Positive for weakness. Negative for seizures, syncope, speech difficulty and numbness.  Psychiatric/Behavioral: Negative for memory loss.     Physical Exam Updated Vital Signs BP (!) 172/100   Pulse 61   Temp 97.8 F (36.6 C) (Oral)   Resp 18   Ht 5\' 10"  (1.778 m)   Wt 79.4 kg (175 lb)   SpO2 96%   BMI 25.11 kg/m   Physical Exam  Constitutional: She is oriented to person, place, and time. She appears well-developed and well-nourished.  HENT:  Head: Normocephalic. Head is without raccoon's eyes, without Battle's sign and without laceration.    Right Ear: Tympanic membrane and external ear normal.  Left Ear: Tympanic membrane and external ear normal.  Nose: Nose normal. No epistaxis.  Mouth/Throat: Uvula is midline and oropharynx is clear and moist.  Eyes: Conjunctivae are normal.  Neck: Full passive range  of motion without pain. Neck supple.  Cardiovascular: Normal rate, regular rhythm, normal heart sounds and intact distal pulses.  Pulmonary/Chest: Effort normal and breath sounds normal.  Abdominal: Soft. There is no tenderness. There is no guarding.  Musculoskeletal: Normal range of motion. She exhibits no edema, tenderness or deformity.  Neurological: She is alert and oriented to person, place, and time. She has normal strength. No cranial nerve deficit or sensory deficit. Gait normal. GCS eye subscore is 4. GCS verbal subscore is 5. GCS motor subscore is 6.  Skin: Skin is warm and dry.  Psychiatric: She has a normal mood and affect.     ED Treatments / Results  Labs (all labs ordered are listed, but only abnormal results are displayed) Labs Reviewed - No data to display  EKG None  Radiology Ct Head Wo Contrast  Result Date:  10/08/2017 CLINICAL DATA:  Posttraumatic headache after head injury at work today. EXAM: CT HEAD WITHOUT CONTRAST TECHNIQUE: Contiguous axial images were obtained from the base of the skull through the vertex without intravenous contrast. COMPARISON:  None. FINDINGS: Brain: Mild chronic ischemic white matter disease is noted. No mass effect or midline shift is noted. Ventricular size is within normal limits. There is no evidence of mass lesion, hemorrhage or acute infarction. Vascular: No hyperdense vessel or unexpected calcification. Skull: Normal. Negative for fracture or focal lesion. Sinuses/Orbits: No acute finding. Other: None. IMPRESSION: Mild chronic ischemic white matter disease. No acute intracranial abnormality seen. Electronically Signed   By: Marijo Conception, M.D.   On: 10/08/2017 16:12    Procedures Procedures (including critical care time)  Medications Ordered in ED Medications - No data to display   Initial Impression / Assessment and Plan / ED Course  I have reviewed the triage vital signs and the nursing notes.  Pertinent labs & imaging  results that were available during my care of the patient were reviewed by me and considered in my medical decision making (see chart for details).      Final Clinical Impressions(s) / ED Diagnoses   Final diagnoses:  Injury of head, initial encounter    ED Discharge Orders    None       Hayden Rasmussen, MD 10/09/17 [phone]

## 2017-10-08 NOTE — Discharge Instructions (Addendum)
Your evaluated in the emergency department for a head injury sustained at work.  Your CAT scan did not show any signs of any bleeding on the brain.  You should rest and take Tylenol and ibuprofen for pain.  Please follow-up with your doctor and return if any worsening symptoms.

## 2017-10-08 NOTE — ED Notes (Signed)
ED Provider at bedside. 

## 2017-10-08 NOTE — ED Notes (Signed)
Pt reports she has felt off balance prior to but also reports she has issues with vertigo and is unsure if it is related to hitting her head or not.

## 2017-10-08 NOTE — ED Triage Notes (Signed)
While at work today she hit her head. Hematoma to the left side of her head. States at times her vision is blurred. She is ambulatory in no distress at triage. No UDS requires.

## 2017-10-09 ENCOUNTER — Encounter (HOSPITAL_BASED_OUTPATIENT_CLINIC_OR_DEPARTMENT_OTHER): Payer: Self-pay

## 2017-10-09 ENCOUNTER — Emergency Department (HOSPITAL_BASED_OUTPATIENT_CLINIC_OR_DEPARTMENT_OTHER)
Admission: EM | Admit: 2017-10-09 | Discharge: 2017-10-09 | Disposition: A | Discharge status: Home / Self Care | Payer: Worker's Compensation | Attending: Emergency Medicine | Admitting: Emergency Medicine

## 2017-10-09 ENCOUNTER — Other Ambulatory Visit: Payer: Self-pay

## 2017-10-09 DIAGNOSIS — S060X0D Concussion without loss of consciousness, subsequent encounter: Secondary | ICD-10-CM

## 2017-10-09 DIAGNOSIS — Z87891 Personal history of nicotine dependence: Secondary | ICD-10-CM | POA: Insufficient documentation

## 2017-10-09 DIAGNOSIS — M542 Cervicalgia: Secondary | ICD-10-CM | POA: Diagnosis not present

## 2017-10-09 DIAGNOSIS — I10 Essential (primary) hypertension: Secondary | ICD-10-CM | POA: Insufficient documentation

## 2017-10-09 DIAGNOSIS — Z79899 Other long term (current) drug therapy: Secondary | ICD-10-CM | POA: Diagnosis not present

## 2017-10-09 DIAGNOSIS — S0990XD Unspecified injury of head, subsequent encounter: Secondary | ICD-10-CM

## 2017-10-09 DIAGNOSIS — M62838 Other muscle spasm: Secondary | ICD-10-CM | POA: Insufficient documentation

## 2017-10-09 DIAGNOSIS — X58XXXD Exposure to other specified factors, subsequent encounter: Secondary | ICD-10-CM | POA: Diagnosis not present

## 2017-10-09 DIAGNOSIS — R110 Nausea: Secondary | ICD-10-CM | POA: Diagnosis not present

## 2017-10-09 MED ORDER — CYCLOBENZAPRINE HCL 10 MG PO TABS: 10.0000 mg | ORAL_TABLET | Freq: Three times a day (TID) | ORAL | 0 refills | Status: DC | PRN

## 2017-10-09 MED ORDER — ONDANSETRON 4 MG PO TBDP: 4.0000 mg | ORAL_TABLET | Freq: Three times a day (TID) | ORAL | 0 refills | Status: DC | PRN

## 2017-10-09 NOTE — ED Provider Notes (Signed)
[facility]PART[facility] Provider Note   CSN: [id number] Arrival date & time: 10/09/17  1326     History   Chief Complaint Chief Complaint  Patient presents with  . Head Injury    HPI Sherry Butler is a 67 y.o. female with a PMHx of vertigo, HTN, HLD, OSA on CPAP, macular degeneration, GERD, and other conditions listed below, who presents to the ED with complaints of ongoing mild headache since yesterday after minor head trauma.  Per chart review, pt was seen here yesterday after she hit her head on the corner of a metal cabinet at work; she had a CT head which was negative, and therefore was discharged home.  She went home and did some yardwork and noticed her headache continued, and she got nauseated. She went to sleep and "slept really hard", woke up this morning feeling somewhat better so she went to work. Once at work, when she was trying to concentrate on tasks or focus on the computer, her nausea and headache got worse so she came here for further evaluation.  She describes a headache as 3/10 constant dull and aching left posterior head pain that radiates somewhat into the left neck area, with no known aggravating factors, and no treatments tried prior to arrival.  She states that the nausea comes and goes and she does any mental activities such as concentrating, yard work, looking at the computer, etc.  She did not lose consciousness yesterday during the incident, and she is not on any blood thinners.  She denies vision changes, lightheadedness, LOC, fevers, chills, CP, SOB, abd pain, V/D/C, hematuria, dysuria, other myalgias/arthralgias, numbness, tingling, focal weakness, or any other complaints at this time.   The history is provided by the patient and medical records. No language interpreter was used.  Head Injury   Pertinent negatives include no numbness, no vomiting and no weakness.    Past Medical History:  Diagnosis Date  . Depression   . High  cholesterol   . Hypertension   . Vertigo     Patient Active Problem List   Diagnosis Date Noted  . Left wrist pain 03/28/2017  . HSV-2 (herpes simplex virus 2) infection 10/24/2016  . Bilateral high frequency sensorineural hearing loss 09/04/2016  . OSA on CPAP 08/23/2016  . Essential hypertension 08/23/2016  . Mild depression ([place]) 08/23/2016  . Vertigo 08/23/2016  . Vitamin D deficiency 02/11/2015  . Anxiety 02/11/2015  . Mixed hyperlipidemia 02/11/2015  . Cataract extraction status 11/17/2014  . Osteopenia 11/09/2014  . Calculus of kidney 11/09/2014  . Other seborrheic keratosis 09/30/2014  . Neovascularization, retina 11/05/2013  . Retinal hemorrhage 11/05/2013  . Retinal macular atrophy 11/05/2013  . Progressive high myopia 11/05/2013  . Nonexudative age-related macular degeneration 11/05/2013  . Low back pain 07/20/2013  . Fatty liver 09/04/2012  . GERD (gastroesophageal reflux disease) 03/06/2012    Past Surgical History:  Procedure Laterality Date  . ABDOMINAL HYSTERECTOMY    . EYE SURGERY    . TUBAL LIGATION       OB History   None      Home Medications    Prior to Admission medications   Medication Sig Start Date End Date Taking? Authorizing Provider  benzonatate (TESSALON) 100 MG capsule Take 1 capsule (100 mg total) by mouth every 8 (eight) hours. 07/21/17   Jacqlyn Larsen, PA-C  carbamide peroxide (DEBROX) 6.5 % OTIC solution Place 5 drops into both ears 2 (two) times daily. 07/21/17  Jacqlyn Larsen, PA-C  cholecalciferol (VITAMIN D) 1000 units tablet Take 1,000 Units by mouth daily.    [provider]  COD LIVER OIL PO Take 1 capsule by mouth daily.    [provider]  cyclobenzaprine (FLEXERIL) 5 MG tablet Take 1 tablet (5 mg total) by mouth 3 (three) times daily as needed for muscle spasms. 06/18/17   Saguier, Percell Miller, PA-C  diclofenac (CATAFLAM) 50 MG tablet Take 1 tablet (50 mg total) by mouth 2 (two) times daily. 06/18/17   Saguier,  Percell Miller, PA-C  escitalopram (LEXAPRO) 10 MG tablet TAKE 1 TABLET BY MOUTH EVERY OTHER DAY 09/27/17   Copland, Gay Filler, MD  guaiFENesin-codeine 100-10 MG/5ML syrup Take 5 mLs by mouth at bedtime as needed for cough. 07/21/17   Jacqlyn Larsen, PA-C  lisinopril (PRINIVIL,ZESTRIL) 10 MG tablet TAKE 1 TABLET BY MOUTH ONCE DAILY 09/27/17   Copland, Gay Filler, MD  lovastatin (MEVACOR) 20 MG tablet Take 1 tablet (20 mg total) by mouth at bedtime. 10/29/16   Copland, Gay Filler, MD  meclizine (ANTIVERT) 25 MG tablet Take 1 tablet (25 mg total) by mouth 3 (three) times daily as needed for dizziness. 03/20/17   Saguier, Percell Miller, PA-C  thiamine (VITAMIN B-1) 100 MG tablet Take 100 mg by mouth daily.    [provider]    Family History No family history on file.  Social History Social History   Tobacco Use  . Smoking status: Former Research scientist (life sciences)  . Smokeless tobacco: Never Used  Substance Use Topics  . Alcohol use: No  . Drug use: Not on file     Allergies   Propoxyphene; Demerol  [meperidine hcl]; Demerol [meperidine]; and Valium [diazepam]   Review of Systems Review of Systems  Constitutional: Negative for chills and fever.  Eyes: Negative for visual disturbance.  Respiratory: Negative for shortness of breath.   Cardiovascular: Negative for chest pain.  Gastrointestinal: Positive for nausea. Negative for abdominal pain, constipation, diarrhea and vomiting.  Genitourinary: Negative for dysuria and hematuria.  Musculoskeletal: Positive for neck pain. Negative for arthralgias and myalgias.  Skin: Negative for color change.  Allergic/Immunologic: Negative for immunocompromised state.  Neurological: Positive for headaches. Negative for syncope, weakness, light-headedness and numbness.  Hematological: Does not bruise/bleed easily.  Psychiatric/Behavioral: Negative for confusion.   All other systems reviewed and are negative for acute change except as noted in the HPI.    Physical  Exam Updated Vital Signs BP (!) 162/87 (BP Location: Left Arm)   Pulse 61   Temp 98.1 F (36.7 C) (Oral)   Resp 18   Ht 5\' 10"  (1.778 m)   Wt 80.3 kg (177 lb)   SpO2 98%   BMI 25.40 kg/m   Physical Exam  Constitutional: She is oriented to person, place, and time. Vital signs are normal. She appears well-developed and well-nourished.  Non-toxic appearance. No distress.  Afebrile, nontoxic, NAD  HENT:  Head: Normocephalic. Head is with contusion. Head is without raccoon's eyes, without Battle's sign and without abrasion.    Mouth/Throat: Oropharynx is clear and moist and mucous membranes are normal.  Small hematoma to L parietal scalp which is mildly TTP, but otherwise no other scalp/face TTP, no bony stepoffs or deformities, no abrasions, no raccoon eyes or battle's sign.   Eyes: Pupils are equal, round, and reactive to light. Conjunctivae and EOM are normal. Right eye exhibits no discharge. Left eye exhibits no discharge.  PERRL, EOMI, no nystagmus, IOLs visible through the pupils bilaterally  Neck: Normal range of motion. Neck supple. Muscular tenderness present. No spinous process tenderness present. No neck rigidity. Normal range of motion present.  FROM intact without spinous process TTP, no bony stepoffs or deformities, with mild L sided paraspinous muscle TTP and muscle spasms. No rigidity or meningeal signs. No bruising or swelling.   Cardiovascular: Normal rate and intact distal pulses.  Pulmonary/Chest: Effort normal. No respiratory distress.  Abdominal: Normal appearance. She exhibits no distension.  Musculoskeletal: Normal range of motion.  MAE x4 Strength and sensation grossly intact in all extremities Distal pulses intact Gait steady  Neurological: She is alert and oriented to person, place, and time. She has normal strength. No cranial nerve deficit or sensory deficit. Coordination and gait normal. GCS eye subscore is 4. GCS verbal subscore is 5. GCS motor subscore is  6.  CN 2-12 grossly intact A&O x4 GCS 15 Sensation and strength intact Gait nonataxic including with tandem walking Coordination with finger-to-nose WNL Neg pronator drift   Skin: Skin is warm, dry and intact. No rash noted.  Psychiatric: She has a normal mood and affect. Her behavior is normal.  Nursing note and vitals reviewed.    ED Treatments / Results  Labs (all labs ordered are listed, but only abnormal results are displayed) Labs Reviewed - No data to display  EKG None  Radiology Ct Head Wo Contrast  Result Date: 10/08/2017 CLINICAL DATA:  Posttraumatic headache after head injury at work today. EXAM: CT HEAD WITHOUT CONTRAST TECHNIQUE: Contiguous axial images were obtained from the base of the skull through the vertex without intravenous contrast. COMPARISON:  None. FINDINGS: Brain: Mild chronic ischemic white matter disease is noted. No mass effect or midline shift is noted. Ventricular size is within normal limits. There is no evidence of mass lesion, hemorrhage or acute infarction. Vascular: No hyperdense vessel or unexpected calcification. Skull: Normal. Negative for fracture or focal lesion. Sinuses/Orbits: No acute finding. Other: None. IMPRESSION: Mild chronic ischemic white matter disease. No acute intracranial abnormality seen. Electronically Signed   By: Marijo Conception, M.D.   On: 10/08/2017 16:12    Procedures Procedures (including critical care time)  Medications Ordered in ED Medications - No data to display   Initial Impression / Assessment and Plan / ED Course  I have reviewed the triage vital signs and the nursing notes.  Pertinent labs & imaging results that were available during my care of the patient were reviewed by me and considered in my medical decision making (see chart for details).     67 y.o. female here with concussion symptoms since yesterday when she hit her head. Was evaluated for minor head injury yesterday, CT head neg; went home and  didn't really do any mental rest. Since then, continued to have mild headache, but also has nausea with any mental activities. On exam, no focal neuro deficits, mild L sided neck tenderness and spasm but no midline spinal TTP, small hematoma to L parietal scalp area, no crepitus/deformity, no s/sx of basilar skull fx, gait steady. Doubt need for repeat CT or any imaging/labs, symptoms consistent with concussion. Strongly encouraged mental rest, will send home with zofran and flexeril, advised use of tylenol/ibuprofen and ice/heat use. F/up with PCP in 3-5 days for recheck. I explained the diagnosis and have given explicit precautions to return to the ER including for any other new or worsening symptoms. The patient understands and accepts the medical plan as it's been dictated and I have answered their questions. Discharge  instructions concerning home care and prescriptions have been given. The patient is STABLE and is discharged to home in good condition.    Final Clinical Impressions(s) / ED Diagnoses   Final diagnoses:  Concussion without loss of consciousness, subsequent encounter  Minor head injury, subsequent encounter  Nausea  Neck muscle spasm    ED Discharge Orders        Ordered    ondansetron (ZOFRAN ODT) 4 MG disintegrating tablet  Every 8 hours PRN     10/09/17 1435    cyclobenzaprine (FLEXERIL) 10 MG tablet  3 times daily PRN     10/09/17 [address], [place], [place] 10/09/17 1444    Virgel Manifold, MD 10/09/17 [phone]

## 2017-10-09 NOTE — Discharge Instructions (Addendum)
Alternate between Ibuprofen and Tylenol for pain. Get plenty of rest, use ice on your head.  Stay in a quiet, not simulating, dark environment. No TV, computer use, video games, or cell phone use until headache is resolved completely. Use zofran as directed as needed for nausea. Stay well hydrated. Use flexeril as directed as needed for muscle tightness/spasm but don't drive or operate machinery while taking this medication. Use heat to your neck as needed for pain/tightness, no more than 20 minutes per hour Follow Up with primary care physician in 3-5 days for recheck of symptoms.  Return to the emergency department if patient becomes lethargic, begins vomiting or other change in mental status, or any other changes/worsening symptoms.

## 2017-10-09 NOTE — ED Triage Notes (Signed)
Pt states she hit head on metal shelf yesterday at [place] she was seen here yesterday-c/o HA started last night and nausea today-NAD-steady gait

## 2017-10-09 NOTE — ED Notes (Signed)
ED Provider at bedside. 

## 2017-10-11 LAB — COLOGUARD

## 2017-10-16 ENCOUNTER — Telehealth: Payer: Self-pay

## 2017-10-16 ENCOUNTER — Telehealth: Payer: Self-pay | Admitting: *Deleted

## 2017-10-16 NOTE — Telephone Encounter (Signed)
Received results from [facility]; forwarded to provider/SLS 05/29

## 2017-10-16 NOTE — Telephone Encounter (Signed)
10/16/17  ED Follow Up   DISCHARGE DATE: 10/09/17   How have you been since you were released from the hospital?  Feeling better. Back to work   Do you understand why you were in the Ed? Yes his head at work.   Do you understand the discharge instrcutions? Yes per patient    Items Reviewed:  Medications reviewed: Yes   Allergies reviewed: Yes   Dietary changes reviewed: No changes    Functional Questionnaire:   Activities of Daily Living (ADLs): Per patient she can perform all ADL'S independently.  Any patient concerns? Patient would like results from stool testing.  Patient states she never received call regarding referral to Urology.   Confirmed importance and date/time of follow-up visits scheduled: Yes   Confirmed with patient if condition begins to worsen call PCP or go to the ER. Yes    Patient was given the office number and encouragred to call back with questions or concerns. Yes

## 2017-10-17 ENCOUNTER — Encounter: Payer: Self-pay | Admitting: Family Medicine

## 2017-10-17 NOTE — Progress Notes (Signed)
I

## 2017-10-18 ENCOUNTER — Emergency Department (HOSPITAL_BASED_OUTPATIENT_CLINIC_OR_DEPARTMENT_OTHER)
Admission: EM | Admit: 2017-10-18 | Discharge: 2017-10-18 | Disposition: A | Discharge status: Home / Self Care | Payer: Worker's Compensation | Attending: Emergency Medicine | Admitting: Emergency Medicine

## 2017-10-18 ENCOUNTER — Emergency Department (HOSPITAL_COMMUNITY): Payer: Worker's Compensation

## 2017-10-18 ENCOUNTER — Encounter (HOSPITAL_BASED_OUTPATIENT_CLINIC_OR_DEPARTMENT_OTHER): Payer: Self-pay

## 2017-10-18 ENCOUNTER — Other Ambulatory Visit: Payer: Self-pay

## 2017-10-18 DIAGNOSIS — Z79899 Other long term (current) drug therapy: Secondary | ICD-10-CM | POA: Diagnosis not present

## 2017-10-18 DIAGNOSIS — I671 Cerebral aneurysm, nonruptured: Secondary | ICD-10-CM | POA: Insufficient documentation

## 2017-10-18 DIAGNOSIS — I10 Essential (primary) hypertension: Secondary | ICD-10-CM | POA: Diagnosis not present

## 2017-10-18 DIAGNOSIS — F0781 Postconcussional syndrome: Secondary | ICD-10-CM | POA: Diagnosis not present

## 2017-10-18 DIAGNOSIS — R2689 Other abnormalities of gait and mobility: Secondary | ICD-10-CM | POA: Diagnosis not present

## 2017-10-18 DIAGNOSIS — Z87891 Personal history of nicotine dependence: Secondary | ICD-10-CM | POA: Insufficient documentation

## 2017-10-18 DIAGNOSIS — I729 Aneurysm of unspecified site: Secondary | ICD-10-CM | POA: Diagnosis not present

## 2017-10-18 DIAGNOSIS — G935 Compression of brain: Secondary | ICD-10-CM | POA: Diagnosis not present

## 2017-10-18 MED ORDER — GADOBENATE DIMEGLUMINE 529 MG/ML IV SOLN
18.0000 mL | Freq: Once | INTRAVENOUS | Status: AC
  Administered 2017-10-18: 18 mL via INTRAVENOUS

## 2017-10-18 NOTE — ED Notes (Signed)
Pt reports feeling "off balance" since 0700 this morning. Pt was ambulatory into room.

## 2017-10-18 NOTE — ED Notes (Signed)
Pt verbalizes understanding of d/c instructions. Pt ambulatory at d/c with all belongings and with family.   

## 2017-10-18 NOTE — ED Notes (Signed)
[facility] has left at this time. 

## 2017-10-18 NOTE — ED Notes (Signed)
ED Provider at bedside. 

## 2017-10-18 NOTE — ED Provider Notes (Addendum)
[facility] EMERGENCY DEPARTMENT Provider Note   CSN: [id number] Arrival date & time: 10/18/17  0160     History   Chief Complaint Chief Complaint  Patient presents with  . Nausea    HPI Sherry Butler is a 67 y.o. female.  HPI  [name] is a 67yo female with a history of positional vertigo, HTN, HLD, OSA who presents to the Emergency Department for evaluation of balance problem. She was seen in the ED 5/21 after hitting her head on a metal shelf at work. No reported loss of consciousness, not on blood thinners. Had a negative CT scan of the head and discharged home. Was seen the next day for nausea when looking at screens, difficulty concentrating and headache and diagnosed with concussion. Today she states she woke up and has felt off balance with standing and walking. Has not had this before. Is able to ambulate independently, although states "I feel like I have to hold on to things" to maintain her balance. She denies sensation of room spinning. Denies nausea, headache, neck pain, numbness, weakness, vomiting. States that she has had fogginess in her right eye since the head trauma which is unchanged since the initial hit. She has not taken any medication for her symptoms. States that her symptoms do not feel like her normal vertigo which is intermittent and feels more like room spinning. Denies any new trauma since 5/21. States she took her blood pressure medication last night as prescribed. Mri brain and MRA head and neck, if negative then post concussive  Past Medical History:  Diagnosis Date  . Depression   . High cholesterol   . Hypertension   . Vertigo     Patient Active Problem List   Diagnosis Date Noted  . Left wrist pain 03/28/2017  . HSV-2 (herpes simplex virus 2) infection 10/24/2016  . Bilateral high frequency sensorineural hearing loss 09/04/2016  . OSA on CPAP 08/23/2016  . Essential hypertension 08/23/2016  . Mild depression ([place]) 08/23/2016  .  Vertigo 08/23/2016  . Vitamin D deficiency 02/11/2015  . Anxiety 02/11/2015  . Mixed hyperlipidemia 02/11/2015  . Cataract extraction status 11/17/2014  . Osteopenia 11/09/2014  . Calculus of kidney 11/09/2014  . Other seborrheic keratosis 09/30/2014  . Neovascularization, retina 11/05/2013  . Retinal hemorrhage 11/05/2013  . Retinal macular atrophy 11/05/2013  . Progressive high myopia 11/05/2013  . Nonexudative age-related macular degeneration 11/05/2013  . Low back pain 07/20/2013  . Fatty liver 09/04/2012  . GERD (gastroesophageal reflux disease) 03/06/2012    Past Surgical History:  Procedure Laterality Date  . ABDOMINAL HYSTERECTOMY    . EYE SURGERY    . TUBAL LIGATION       OB History   None      Home Medications    Prior to Admission medications   Medication Sig Start Date End Date Taking? Authorizing Provider  benzonatate (TESSALON) 100 MG capsule Take 1 capsule (100 mg total) by mouth every 8 (eight) hours. 07/21/17   Jacqlyn Larsen, PA-C  carbamide peroxide (DEBROX) 6.5 % OTIC solution Place 5 drops into both ears 2 (two) times daily. 07/21/17   Jacqlyn Larsen, PA-C  cholecalciferol (VITAMIN D) 1000 units tablet Take 1,000 Units by mouth daily.    [provider]  COD LIVER OIL PO Take 1 capsule by mouth daily.    [provider]  cyclobenzaprine (FLEXERIL) 10 MG tablet Take 1 tablet (10 mg total) by mouth 3 (three) times daily as needed  for muscle spasms. 10/09/17   Street, [place], PA-C  cyclobenzaprine (FLEXERIL) 5 MG tablet Take 1 tablet (5 mg total) by mouth 3 (three) times daily as needed for muscle spasms. 06/18/17   Saguier, Percell Miller, PA-C  diclofenac (CATAFLAM) 50 MG tablet Take 1 tablet (50 mg total) by mouth 2 (two) times daily. 06/18/17   Saguier, Percell Miller, PA-C  escitalopram (LEXAPRO) 10 MG tablet TAKE 1 TABLET BY MOUTH EVERY OTHER DAY 09/27/17   Copland, Gay Filler, MD  guaiFENesin-codeine 100-10 MG/5ML syrup Take 5 mLs by mouth at bedtime as  needed for cough. 07/21/17   Jacqlyn Larsen, PA-C  lisinopril (PRINIVIL,ZESTRIL) 10 MG tablet TAKE 1 TABLET BY MOUTH ONCE DAILY 09/27/17   Copland, Gay Filler, MD  lovastatin (MEVACOR) 20 MG tablet Take 1 tablet (20 mg total) by mouth at bedtime. 10/29/16   Copland, Gay Filler, MD  meclizine (ANTIVERT) 25 MG tablet Take 1 tablet (25 mg total) by mouth 3 (three) times daily as needed for dizziness. 03/20/17   Saguier, Percell Miller, PA-C  ondansetron (ZOFRAN ODT) 4 MG disintegrating tablet Take 1 tablet (4 mg total) by mouth every 8 (eight) hours as needed for nausea or vomiting. 10/09/17   Street, [place], PA-C  thiamine (VITAMIN B-1) 100 MG tablet Take 100 mg by mouth daily.    [provider]    Family History No family history on file.  Social History Social History   Tobacco Use  . Smoking status: Former Research scientist (life sciences)  . Smokeless tobacco: Never Used  Substance Use Topics  . Alcohol use: No  . Drug use: Not on file     Allergies   Propoxyphene; Demerol  [meperidine hcl]; Demerol [meperidine]; and Valium [diazepam]   Review of Systems Review of Systems  Constitutional: Negative for chills and fever.  Eyes: Positive for visual disturbance (left eye foggy).  Respiratory: Negative for shortness of breath.   Cardiovascular: Negative for chest pain.  Gastrointestinal: Positive for nausea (intermittently, none currently). Negative for abdominal pain and vomiting.  Genitourinary: Negative for difficulty urinating.  Musculoskeletal: Positive for gait problem (feels off balance).  Skin: Negative for wound.  Neurological: Negative for syncope, weakness, numbness and headaches.  Psychiatric/Behavioral: Negative for agitation.     Physical Exam Updated Vital Signs BP (!) 186/83 (BP Location: Left Arm)   Pulse 60   Temp 98.6 F (37 C) (Oral)   Resp 16   Ht 5\' 10"  (1.778 m)   Wt 53.1 kg (117 lb)   SpO2 98%   BMI 16.79 kg/m   Physical Exam  Constitutional: She is oriented to person,  place, and time. She appears well-developed and well-nourished. No distress.  HENT:  Head: Normocephalic and atraumatic.  Mouth/Throat: Oropharynx is clear and moist. No oropharyngeal exudate.  Eyes: Pupils are equal, round, and reactive to light. Conjunctivae and EOM are normal. Right eye exhibits no discharge. Left eye exhibits no discharge.  Neck: Normal range of motion. Neck supple.  Full ROM. No cervical spine or paraspinal tenderness.   Cardiovascular: Normal rate, regular rhythm and intact distal pulses.  Pulmonary/Chest: Effort normal and breath sounds normal. No stridor. No respiratory distress. She has no wheezes. She has no rales.  Abdominal: Soft. There is no tenderness.  Neurological: She is alert and oriented to person, place, and time. Coordination normal.  Mental Status:  Alert, oriented, thought content appropriate, able to give a coherent history. Speech fluent without evidence of aphasia. Able to follow 2 step commands without difficulty.  Cranial Nerves:  II:  Peripheral visual fields grossly normal, pupils equal, round, reactive to light III,IV, VI: ptosis not present, extra-ocular motions intact bilaterally. Nystagmus with looking left.  V,VII: smile symmetric, facial light touch sensation equal VIII: hearing grossly normal to voice  X: uvula elevates symmetrically  XI: bilateral shoulder shrug symmetric and strong XII: midline tongue extension without fassiculations Motor:  Normal tone. 5/5 in upper and lower extremities bilaterally including strong and equal grip strength and dorsiflexion/plantar flexion Sensory: Sensation to light touch normal in all extremities.  Cerebellar: normal finger-to-nose with bilateral upper extremities. Positive Romberg.  Gait: Able to ambulate independently without support 41ft in the hallway, although walks toward right. Gait with good coordination and balance.  Skin: Skin is warm and dry. She is not diaphoretic.  Psychiatric: She has  a normal mood and affect. Her behavior is normal.  Nursing note and vitals reviewed.    ED Treatments / Results  Labs (all labs ordered are listed, but only abnormal results are displayed) Labs Reviewed - No data to display  EKG None  Radiology No results found.  Procedures Procedures (including critical care time)  Medications Ordered in ED Medications - No data to display   Initial Impression / Assessment and Plan / ED Course  I have reviewed the triage vital signs and the nursing notes.  Pertinent labs & imaging results that were available during my care of the patient were reviewed by me and considered in my medical decision making (see chart for details).     Patient presents the emergency department for evaluation of feeling off balance.  Of note she was recently seen in the ED following minor head injury in which she had a negative CT scan 5/21.  She is not on blood thinners.  Was seen since the injury and diagnosed with concussion.    Today patient reports that she has new problems with her balance.  Denies room spinning sensation.  States that she has to hold onto things when walking.  Reports waxing and waning headache, denies headache currently.  Has some left-sided neck pain.  On exam she is able to ambulate independently, although seems to walk and lean towards the right.  Romberg positive.  Some nystagmus with looking towards the left.  Concern for potential central process causeing balance problems. Discussed this patient with Neurologist Dr. Leonel Ramsay who recommends MRI head, MRA head and neck for further evaluation. If negative, symptoms are likely concussive in nature and patient can be discharged home. If any acute findings, Neurology will need to be consulted. Patient will be transferred to [facility] hospital for MRI/MRA. She does not have transportation to [facility] and will be transported via EMS. Discussed patient with Dr. Johnney Killian who accepts patient at [facility]. This was a  shared visit with Dr. Sherry Ruffing who also saw the patient and agrees with plan.    Final Clinical Impressions(s) / ED Diagnoses   Final diagnoses:  None    ED Discharge Orders    None       Glyn Ade, PA-C 10/18/17 1136    Glyn Ade, PA-C 10/18/17 1136    Tegeler, Gwenyth Allegra, MD 10/20/17 [phone]

## 2017-10-18 NOTE — ED Notes (Signed)
Report call to [facility] with [facility]. ETA of 15 minutes.Marland Kitchen

## 2017-10-18 NOTE — ED Triage Notes (Signed)
Pt states hit her head at work on 5/21, has been seen multiple times since for a concussion and had a MRI. States still having dizziness and nausea off and on.

## 2017-10-20 NOTE — Progress Notes (Signed)
[place] at [facility] [address] [place], [place] [address] [phone]  Date:  10/23/2017   Name:  Sherma Vanmetre   DOB:  1951-03-27   MRN:  [id number]  PCP:  Darreld Mclean, MD    Chief Complaint: Hospitalization Follow-up (concussion, somewhat feeling better, 10/08/17)   History of Present Illness:  Georgian Murley is a 67 y.o. very pleasant female patient who presents with the following:  ER follow-up visit today history of OSA, HTN, hyperlipidemia  She was in the ER on 5/31 as follows:  Patient presents the emergency department for evaluation of feeling off balance.  Of note she was recently seen in the ED following minor head injury in which she had a negative CT scan 5/21.  She is not on blood thinners.  Was seen since the injury and diagnosed with concussion.   Today patient reports that she has new problems with her balance.  Denies room spinning sensation.  States that she has to hold onto things when walking.  Reports waxing and waning headache, denies headache currently.  Has some left-sided neck pain. On exam she is able to ambulate independently, although seems to walk and lean towards the right.  Romberg positive.  Some nystagmus with looking towards the left. Concern for potential central process causeing balance problems. Discussed this patient with Neurologist Dr. Leonel Ramsay who recommends MRI head, MRA head and neck for further evaluation. If negative, symptoms are likely concussive in nature and patient can be discharged home. If any acute findings, Neurology will need to be consulted. Patient will be transferred to [facility] hospital for MRI/MRA. She does not have transportation to [facility] and will be transported via EMS. Discussed patient with Dr. Johnney Killian who accepts patient at [facility]. This was a shared visit with Dr. Sherry Ruffing who also saw the patient and agrees with plan.    She did have her MRI scans as  follows: IMPRESSION: MRI HEAD IMPRESSION: 1. No acute intracranial abnormality. 2. Chiari 1 malformation with the cerebellar tonsils extending 11 mm below the foramen magnum. 3. Moderate cerebral white matter changes involving the supratentorial white matter and pons, nonspecific, but most likely related chronic small vessel ischemic disease.  MRA HEAD IMPRESSION: 1. Negative intracranial MRA for large vessel occlusion. No high-grade or correctable stenosis. 2. 4 mm left posterior communicating artery aneurysm.  MRA NECK IMPRESSION: 1. Negative MRA of the neck for high-grade or critical flow limiting stenosis. 2. Short-segment mild proximal left V2 stenosis. Vertebral arteries otherwise widely patent within the neck with no findings to suggest vertebrobasilar insufficiency. 3. Aberrant right subclavian artery.  Since her ER visit, [place] has been back to work- she has been doing ok but states that she finds her job to be more difficult that it would be normally.  She is not as quick on her computer and feels slightly slowed down. She is not really having HA however.  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  I can't see this appt in epic so I am not sure who she is seeing Patient Active Problem List   Diagnosis Date Noted  . Left wrist pain 03/28/2017  . HSV-2 (herpes simplex virus 2) infection 10/24/2016  . Bilateral high frequency sensorineural hearing loss 09/04/2016  .  OSA on CPAP 08/23/2016  . Essential hypertension 08/23/2016  . Mild depression ([place]) 08/23/2016  . Vertigo 08/23/2016  . Vitamin D deficiency 02/11/2015  . Anxiety 02/11/2015  . Mixed hyperlipidemia 02/11/2015  . Cataract extraction status 11/17/2014  . Osteopenia 11/09/2014  . Calculus of kidney 11/09/2014  . Other seborrheic keratosis  09/30/2014  . Neovascularization, retina 11/05/2013  . Retinal hemorrhage 11/05/2013  . Retinal macular atrophy 11/05/2013  . Progressive high myopia 11/05/2013  . Nonexudative age-related macular degeneration 11/05/2013  . Low back pain 07/20/2013  . Fatty liver 09/04/2012  . GERD (gastroesophageal reflux disease) 03/06/2012    Past Medical History:  Diagnosis Date  . Depression   . High cholesterol   . Hypertension   . Vertigo     Past Surgical History:  Procedure Laterality Date  . ABDOMINAL HYSTERECTOMY    . EYE SURGERY    . TUBAL LIGATION      Social History   Tobacco Use  . Smoking status: Former Research scientist (life sciences)  . Smokeless tobacco: Never Used  Substance Use Topics  . Alcohol use: No  . Drug use: Not on file    No family history on file.  Allergies  Allergen Reactions  . Propoxyphene Nausea And Vomiting    Propoxyphene and Acetaminophen  . Demerol  [Meperidine Hcl] Nausea And Vomiting  . Demerol [Meperidine]   . Valium [Diazepam]     Medication list has been reviewed and updated.  Current Outpatient Medications on File Prior to Visit  Medication Sig Dispense Refill  . carbamide peroxide (DEBROX) 6.5 % OTIC solution Place 5 drops into both ears 2 (two) times daily. 15 mL 0  . cholecalciferol (VITAMIN D) 1000 units tablet Take 1,000 Units by mouth daily.    . COD LIVER OIL PO Take 1 capsule by mouth daily.    Marland Kitchen escitalopram (LEXAPRO) 10 MG tablet TAKE 1 TABLET BY MOUTH EVERY OTHER DAY 15 tablet 5  . lisinopril (PRINIVIL,ZESTRIL) 10 MG tablet TAKE 1 TABLET BY MOUTH ONCE DAILY 90 tablet 1  . thiamine (VITAMIN B-1) 100 MG tablet Take 100 mg by mouth daily.     No current facility-administered medications on file prior to visit.     Review of Systems:  As per HPI- otherwise negative. No fever or chills No CP or SOB Able to eat ok    Physical Examination: Vitals:   10/23/17 1141  BP: 120/70  Pulse: 83  Resp: 16  SpO2: 96%   Vitals:   10/23/17 1141   Weight: 176 lb (79.8 kg)  Height: 5\' 10"  (1.778 m)   Body mass index is 25.25 kg/m. Ideal Body Weight: Weight in (lb) to have BMI = 25: 173.9  GEN: WDWN, NAD, Non-toxic, A & O x 3, looks well, normal weight Accompanied by her husband today  HEENT: Atraumatic, Normocephalic. Neck supple. No masses, No LAD.  Bilateral TM wnl, oropharynx normal.  PEERL,EOMI.   Ears and Nose: No external deformity. CV: RRR, No M/G/R. No JVD. No thrill. No extra heart sounds. PULM: CTA B, no wheezes, crackles, rhonchi. No retractions. No resp. distress. No accessory muscle use. ABD: S, NT, ND, +BS. No rebound. No HSM. EXTR: No c/c/e NEURO Normal gait.  Normal strength and DTR of all extremities. Wobbles but does not step on romberg- pt reports she did "much worse" on this test in the ER recently  PSYCH: Normally interactive. Conversant. Not depressed or anxious appearing.  Calm demeanor.    Assessment and Plan: Concussion  without loss of consciousness, initial encounter  Closed head injury, subsequent encounter  Following up on concussion- she sustained head injury at work on 5/21 when she stood up and hit her head on a cabinet.  She had a CT of her head at that time This past Friday she went back to the ER due to feeling dizzy and strange.  Underwent MRI/A of her head and neck and was allowed to go home.  She has a follow-up visit for these findings next week/  At this time she still has mild concussion sx. She is going to work but this does exacerbate her sx.  Will have her reduce her hours at work for a week. If sx are not resolved, or if sx return when she goes back to full duty, will make other plans as far as her RTW scheduled Encouraged mental and physical rest   Signed Lamar Blinks, MD

## 2017-10-23 ENCOUNTER — Ambulatory Visit (INDEPENDENT_AMBULATORY_CARE_PROVIDER_SITE_OTHER): Payer: PPO | Admitting: Family Medicine

## 2017-10-23 ENCOUNTER — Encounter: Payer: Self-pay | Admitting: Family Medicine

## 2017-10-23 VITALS — BP 120/70 | HR 83 | Resp 16 | Ht 70.0 in | Wt 176.0 lb

## 2017-10-23 DIAGNOSIS — S060X0A Concussion without loss of consciousness, initial encounter: Secondary | ICD-10-CM

## 2017-10-23 DIAGNOSIS — S0990XD Unspecified injury of head, subsequent encounter: Secondary | ICD-10-CM

## 2017-10-23 NOTE — Patient Instructions (Signed)
It seems that you have a concussion- this is like a bruise of the brain.  You are improving, but not well yet.   Rest- both physical and mental- is essential to get you back to normal. We will have you work only 4 hours per day for the next week Try to avoid screens, reading, and intense exercise until you feel normal Assuming you are feeling well, after this next week you can return to your normal schedule.  However if you are still having symptoms please alert me!

## 2017-10-28 DIAGNOSIS — I671 Cerebral aneurysm, nonruptured: Secondary | ICD-10-CM | POA: Diagnosis not present

## 2017-10-28 DIAGNOSIS — Z6825 Body mass index (BMI) 25.0-25.9, adult: Secondary | ICD-10-CM | POA: Diagnosis not present

## 2017-10-28 DIAGNOSIS — G935 Compression of brain: Secondary | ICD-10-CM | POA: Diagnosis not present

## 2017-10-28 DIAGNOSIS — I10 Essential (primary) hypertension: Secondary | ICD-10-CM | POA: Diagnosis not present

## 2017-10-29 ENCOUNTER — Other Ambulatory Visit: Payer: Self-pay | Admitting: Neurosurgery

## 2017-10-29 DIAGNOSIS — I671 Cerebral aneurysm, nonruptured: Secondary | ICD-10-CM

## 2017-11-04 ENCOUNTER — Other Ambulatory Visit: Payer: Self-pay | Admitting: Neurosurgery

## 2017-11-04 DIAGNOSIS — N200 Calculus of kidney: Secondary | ICD-10-CM | POA: Diagnosis not present

## 2017-11-04 DIAGNOSIS — N3281 Overactive bladder: Secondary | ICD-10-CM | POA: Diagnosis not present

## 2017-11-19 ENCOUNTER — Inpatient Hospital Stay (HOSPITAL_COMMUNITY): Admission: RE | Admit: 2017-11-19 | Payer: Self-pay | Source: Ambulatory Visit

## 2017-11-22 ENCOUNTER — Other Ambulatory Visit: Payer: Self-pay | Admitting: Neurosurgery

## 2017-11-22 ENCOUNTER — Ambulatory Visit (HOSPITAL_COMMUNITY)
Admission: RE | Admit: 2017-11-22 | Discharge: 2017-11-22 | Disposition: A | Discharge status: Home / Self Care | Payer: PPO | Source: Ambulatory Visit | Attending: Neurosurgery | Admitting: Neurosurgery

## 2017-11-22 ENCOUNTER — Encounter (HOSPITAL_COMMUNITY): Payer: Self-pay | Admitting: Neurosurgery

## 2017-11-22 DIAGNOSIS — K219 Gastro-esophageal reflux disease without esophagitis: Secondary | ICD-10-CM | POA: Diagnosis not present

## 2017-11-22 DIAGNOSIS — F419 Anxiety disorder, unspecified: Secondary | ICD-10-CM | POA: Diagnosis not present

## 2017-11-22 DIAGNOSIS — R930 Abnormal findings on diagnostic imaging of skull and head, not elsewhere classified: Secondary | ICD-10-CM | POA: Diagnosis not present

## 2017-11-22 DIAGNOSIS — Z87820 Personal history of traumatic brain injury: Secondary | ICD-10-CM | POA: Diagnosis not present

## 2017-11-22 DIAGNOSIS — Z87891 Personal history of nicotine dependence: Secondary | ICD-10-CM | POA: Diagnosis not present

## 2017-11-22 DIAGNOSIS — F329 Major depressive disorder, single episode, unspecified: Secondary | ICD-10-CM | POA: Diagnosis not present

## 2017-11-22 DIAGNOSIS — E782 Mixed hyperlipidemia: Secondary | ICD-10-CM | POA: Diagnosis not present

## 2017-11-22 DIAGNOSIS — E559 Vitamin D deficiency, unspecified: Secondary | ICD-10-CM | POA: Insufficient documentation

## 2017-11-22 DIAGNOSIS — Z885 Allergy status to narcotic agent status: Secondary | ICD-10-CM | POA: Insufficient documentation

## 2017-11-22 DIAGNOSIS — M8580 Other specified disorders of bone density and structure, unspecified site: Secondary | ICD-10-CM | POA: Insufficient documentation

## 2017-11-22 DIAGNOSIS — I671 Cerebral aneurysm, nonruptured: Secondary | ICD-10-CM

## 2017-11-22 DIAGNOSIS — I10 Essential (primary) hypertension: Secondary | ICD-10-CM | POA: Diagnosis not present

## 2017-11-22 DIAGNOSIS — G4733 Obstructive sleep apnea (adult) (pediatric): Secondary | ICD-10-CM | POA: Diagnosis not present

## 2017-11-22 DIAGNOSIS — G935 Compression of brain: Secondary | ICD-10-CM | POA: Diagnosis present

## 2017-11-22 HISTORY — PX: IR ANGIO INTRA EXTRACRAN SEL INTERNAL CAROTID BILAT MOD SED: IMG5363

## 2017-11-22 HISTORY — PX: IR ANGIO VERTEBRAL SEL VERTEBRAL BILAT MOD SED: IMG5369

## 2017-11-22 HISTORY — PX: IR ANGIO INTRA EXTRACRAN SEL COM CAROTID INNOMINATE UNI R MOD SED: IMG5359

## 2017-11-22 HISTORY — PX: IR 3D INDEPENDENT WKST: IMG2385

## 2017-11-22 LAB — CBC WITH DIFFERENTIAL/PLATELET
Abs Immature Granulocytes: 0 10*3/uL (ref 0.0–0.1)
BASOS ABS: 0 10*3/uL (ref 0.0–0.1)
BASOS PCT: 1 %
EOS ABS: 0.1 10*3/uL (ref 0.0–0.7)
EOS PCT: 1 %
HEMATOCRIT: 44.7 % (ref 36.0–46.0)
Hemoglobin: 14.4 g/dL (ref 12.0–15.0)
IMMATURE GRANULOCYTES: 0 %
Lymphocytes Relative: 43 %
Lymphs Abs: 2.4 10*3/uL (ref 0.7–4.0)
MCH: 28.9 pg (ref 26.0–34.0)
MCHC: 32.2 g/dL (ref 30.0–36.0)
MCV: 89.8 fL (ref 78.0–100.0)
Monocytes Absolute: 0.4 10*3/uL (ref 0.1–1.0)
Monocytes Relative: 7 %
NEUTROS PCT: 48 %
Neutro Abs: 2.7 10*3/uL (ref 1.7–7.7)
PLATELETS: 240 10*3/uL (ref 150–400)
RBC: 4.98 MIL/uL (ref 3.87–5.11)
RDW: 12.3 % (ref 11.5–15.5)
WBC: 5.6 10*3/uL (ref 4.0–10.5)

## 2017-11-22 LAB — URINALYSIS, ROUTINE W REFLEX MICROSCOPIC
BILIRUBIN URINE: NEGATIVE
GLUCOSE, UA: NEGATIVE mg/dL
HGB URINE DIPSTICK: NEGATIVE
KETONES UR: NEGATIVE mg/dL
Leukocytes, UA: NEGATIVE
Nitrite: NEGATIVE
PROTEIN: NEGATIVE mg/dL
Specific Gravity, Urine: 1.014 (ref 1.005–1.030)
pH: 8 (ref 5.0–8.0)

## 2017-11-22 LAB — BASIC METABOLIC PANEL
Anion gap: 11 (ref 5–15)
BUN: 17 mg/dL (ref 8–23)
CHLORIDE: 110 mmol/L (ref 98–111)
CO2: 22 mmol/L (ref 22–32)
CREATININE: 0.76 mg/dL (ref 0.44–1.00)
Calcium: 9.4 mg/dL (ref 8.9–10.3)
Glucose, Bld: 97 mg/dL (ref 70–99)
Potassium: 5.1 mmol/L (ref 3.5–5.1)
SODIUM: 143 mmol/L (ref 135–145)

## 2017-11-22 LAB — APTT: APTT: 31 s (ref 24–36)

## 2017-11-22 LAB — PROTIME-INR
INR: 0.9
PROTHROMBIN TIME: 12.1 s (ref 11.4–15.2)

## 2017-11-22 MED ORDER — IOPAMIDOL (ISOVUE-300) INJECTION 61%
INTRAVENOUS | Status: AC
  Administered 2017-11-22: 20 mL
  Filled 2017-11-22: qty 100

## 2017-11-22 MED ORDER — IOPAMIDOL (ISOVUE-300) INJECTION 61%
INTRAVENOUS | Status: AC
  Administered 2017-11-22: 10 mL
  Filled 2017-11-22: qty 100

## 2017-11-22 MED ORDER — HYDRALAZINE HCL 20 MG/ML IJ SOLN
INTRAMUSCULAR | Status: AC
  Filled 2017-11-22: qty 1

## 2017-11-22 MED ORDER — CHLORHEXIDINE GLUCONATE CLOTH 2 % EX PADS: 6.0000 | MEDICATED_PAD | Freq: Once | CUTANEOUS | Status: DC

## 2017-11-22 MED ORDER — MIDAZOLAM HCL 2 MG/2ML IJ SOLN
INTRAMUSCULAR | Status: AC
  Filled 2017-11-22: qty 2

## 2017-11-22 MED ORDER — FENTANYL CITRATE (PF) 100 MCG/2ML IJ SOLN
INTRAMUSCULAR | Status: AC
  Filled 2017-11-22: qty 2

## 2017-11-22 MED ORDER — HEPARIN SODIUM (PORCINE) 1000 UNIT/ML IJ SOLN
INTRAMUSCULAR | Status: AC
  Filled 2017-11-22: qty 1

## 2017-11-22 MED ORDER — FENTANYL CITRATE (PF) 100 MCG/2ML IJ SOLN
INTRAMUSCULAR | Status: AC | PRN
  Administered 2017-11-22: 25 ug via INTRAVENOUS

## 2017-11-22 MED ORDER — HYDRALAZINE HCL 20 MG/ML IJ SOLN
INTRAMUSCULAR | Status: AC | PRN
  Administered 2017-11-22 (×2): 10 mg via INTRAVENOUS

## 2017-11-22 MED ORDER — SODIUM CHLORIDE 0.9 % IV SOLN: INTRAVENOUS | Status: DC

## 2017-11-22 MED ORDER — LIDOCAINE HCL (PF) 1 % IJ SOLN
INTRAMUSCULAR | Status: AC | PRN
  Administered 2017-11-22: 10 mL

## 2017-11-22 MED ORDER — MIDAZOLAM HCL 2 MG/2ML IJ SOLN
INTRAMUSCULAR | Status: AC | PRN
  Administered 2017-11-22 (×2): 1 mg via INTRAVENOUS

## 2017-11-22 MED ORDER — CEFAZOLIN SODIUM-DEXTROSE 2-4 GM/100ML-% IV SOLN: 2.0000 g | INTRAVENOUS | Status: DC

## 2017-11-22 MED ORDER — HYDROCODONE-ACETAMINOPHEN 5-325 MG PO TABS: 1.0000 | ORAL_TABLET | ORAL | Status: DC | PRN

## 2017-11-22 MED ORDER — LIDOCAINE HCL 1 % IJ SOLN
INTRAMUSCULAR | Status: AC
  Filled 2017-11-22: qty 20

## 2017-11-22 MED ORDER — IOPAMIDOL (ISOVUE-300) INJECTION 61%
INTRAVENOUS | Status: AC
  Administered 2017-11-22: 50 mL
  Filled 2017-11-22: qty 50

## 2017-11-22 MED ORDER — HEPARIN SODIUM (PORCINE) 1000 UNIT/ML IJ SOLN
INTRAMUSCULAR | Status: AC | PRN
  Administered 2017-11-22: 2000 [IU] via INTRAVENOUS

## 2017-11-22 NOTE — Discharge Instructions (Addendum)
Cerebral Angiogram, Care After °Refer to this sheet in the next few weeks. These instructions provide you with information on caring for yourself after your procedure. Your health care provider may also give you more specific instructions. Your treatment has been planned according to current medical practices, but problems sometimes occur. Call your health care provider if you have any problems or questions after your procedure. °What can I expect after the procedure? °After your procedure, it is typical to have the following: °· Bruising at the catheter insertion site that usually fades within 1-2 weeks. °· Blood collecting in the tissue (hematoma) that may be painful to the touch. It should usually decrease in size and tenderness within 1-2 weeks. °· A mild headache. ° °Follow these instructions at home: °· Take medicines only as directed by your health care provider. °· You may shower 24-48 hours after the procedure or as directed by your health care provider. Remove the bandage (dressing) and gently wash the site with plain soap and water. Pat the area dry with a clean towel. Do not rub the site, because this may cause bleeding. °· Do not take baths, swim, or use a hot tub until your health care provider approves. °· Check your insertion site every day for redness, swelling, or drainage. °· Do not apply powder or lotion to the site. °· Do not lift over 10 lb (4.5 kg) for 5 days after your procedure or as directed by your health care provider. °· Ask your health care provider when it is okay to: °? Return to work or school. °? Resume usual physical activities or sports. °? Resume sexual activity. °· Do not drive home if you are discharged the same day as the procedure. Have someone else drive you. °· You may drive 24 hours after the procedure unless otherwise instructed by your health care provider. °· Do not operate machinery or power tools for 24 hours after the procedure or as directed by your health care  provider. °· If your procedure was done as an outpatient procedure, which means that you went home the same day as your procedure, a responsible adult should be with you for the first 24 hours after you arrive home. °· Keep all follow-up visits as directed by your health care provider. This is important. °Contact a health care provider if: °· You have a fever. °· You have chills. °· You have increased bleeding from the catheter insertion site. Hold pressure on the site. °Get help right away if: °· You have vision changes or loss of vision. °· You have numbness or weakness on one side of your body. °· You have difficulty talking, or you have slurred speech or cannot speak (aphasia). °· You feel confused or have difficulty remembering. °· You have unusual pain at the catheter insertion site. °· You have redness, warmth, or swelling at the catheter insertion site. °· You have drainage (other than a small amount of blood on the dressing) from the catheter insertion site. °· The catheter insertion site is bleeding, and the bleeding does not stop after 30 minutes of holding steady pressure on the site. °These symptoms may represent a serious problem that is an emergency. Do not wait to see if the symptoms will go away. Get medical help right away. Call your local emergency services (911 in [place] Do not drive yourself to the hospital. °This information is not intended to replace advice given to you by your health care provider. Make sure you discuss any questions   you have with your health care provider. °Document Released: 09/21/2013 Document Revised: 10/13/2015 Document Reviewed: 05/20/2013 °Elsevier Interactive Patient Education © 2017 [employer] °Moderate Conscious Sedation, Adult, Care After °These instructions provide you with information about caring for yourself after your procedure. Your health care provider may also give you more specific instructions. Your treatment has been planned according to current  medical practices, but problems sometimes occur. Call your health care provider if you have any problems or questions after your procedure. °What can I expect after the procedure? °After your procedure, it is common: °· To feel sleepy for several hours. °· To feel clumsy and have poor balance for several hours. °· To have poor judgment for several hours. °· To vomit if you eat too soon. ° °Follow these instructions at home: °For at least 24 hours after the procedure: ° °· Do not: °? Participate in activities where you could fall or become injured. °? Drive. °? Use heavy machinery. °? Drink alcohol. °? Take sleeping pills or medicines that cause drowsiness. °? Make important decisions or sign legal documents. °? Take care of children on your own. °· Rest. °Eating and drinking °· Follow the diet recommended by your health care provider. °· If you vomit: °? Drink water, juice, or soup when you can drink without vomiting. °? Make sure you have little or no nausea before eating solid foods. °General instructions °· Have a responsible adult stay with you until you are awake and alert. °· Take over-the-counter and prescription medicines only as told by your health care provider. °· If you smoke, do not smoke without supervision. °· Keep all follow-up visits as told by your health care provider. This is important. °Contact a health care provider if: °· You keep feeling nauseous or you keep vomiting. °· You feel light-headed. °· You develop a rash. °· You have a fever. °Get help right away if: °· You have trouble breathing. °This information is not intended to replace advice given to you by your health care provider. Make sure you discuss any questions you have with your health care provider. °Document Released: 02/25/2013 Document Revised: 10/10/2015 Document Reviewed: 08/27/2015 °Elsevier Interactive Patient Education © 2018 [employer] ° °

## 2017-11-22 NOTE — Progress Notes (Signed)
Dr Kathyrn Sheriff called and informed of Pt c/o seeing of waving lines. Vs stabe no other symptoms no new orders received. Husband at bedside

## 2017-11-22 NOTE — H&P (Signed)
Chief Complaint   Aneurysm   HPI   HPI: Sherry Butler is a 67 y.o. female with incidentally discovered Chiari I malformation and likely left internal carotis aneurysm   After undergoing CT scan and MRI of the brain due to head injury.   Prior to her head injury, she denied any history of headaches, neck pain, incoordination, imbalance or falls.   Although she has a history of hypertension, she is a nonsmoker.  She has no known family history of intracranial aneurysms.  She presents today for diagnostic cerebral angiogram for further characterization of the aneurysm.  She is without any concerns today.  Patient Active Problem List   Diagnosis Date Noted  . Left wrist pain 03/28/2017  . HSV-2 (herpes simplex virus 2) infection 10/24/2016  . Bilateral high frequency sensorineural hearing loss 09/04/2016  . OSA on CPAP 08/23/2016  . Essential hypertension 08/23/2016  . Mild depression ([place]) 08/23/2016  . Vertigo 08/23/2016  . Vitamin D deficiency 02/11/2015  . Anxiety 02/11/2015  . Mixed hyperlipidemia 02/11/2015  . Cataract extraction status 11/17/2014  . Osteopenia 11/09/2014  . Calculus of kidney 11/09/2014  . Other seborrheic keratosis 09/30/2014  . Neovascularization, retina 11/05/2013  . Retinal hemorrhage 11/05/2013  . Retinal macular atrophy 11/05/2013  . Progressive high myopia 11/05/2013  . Nonexudative age-related macular degeneration 11/05/2013  . Low back pain 07/20/2013  . Fatty liver 09/04/2012  . GERD (gastroesophageal reflux disease) 03/06/2012    PMH: Past Medical History:  Diagnosis Date  . Depression   . High cholesterol   . Hypertension   . Vertigo     PSH: Past Surgical History:  Procedure Laterality Date  . ABDOMINAL HYSTERECTOMY    . EYE SURGERY    . TUBAL LIGATION       (Not in a hospital admission)  SH: Social History   Tobacco Use  . Smoking status: Former Research scientist (life sciences)  . Smokeless tobacco: Never Used  Substance Use Topics  . Alcohol  use: No  . Drug use: Not on file    MEDS: Prior to Admission medications   Medication Sig Start Date End Date Taking? Authorizing Provider  cholecalciferol (VITAMIN D) 1000 units tablet Take 1,000 Units by mouth daily.   Yes [provider]  COD LIVER OIL PO Take 1 capsule by mouth daily.   Yes [provider]  Cyanocobalamin (VITAMIN B-12 PO) Take 1 tablet by mouth daily.   Yes [provider]  escitalopram (LEXAPRO) 10 MG tablet TAKE 1 TABLET BY MOUTH EVERY OTHER DAY Patient taking differently: Take 10 mg by mouth every 4 days 09/27/17  Yes Copland, Gay Filler, MD  GARLIC PO Take 1 capsule by mouth daily.   Yes [provider]  lisinopril (PRINIVIL,ZESTRIL) 10 MG tablet TAKE 1 TABLET BY MOUTH ONCE DAILY 09/27/17  Yes Copland, Gay Filler, MD  carbamide peroxide (DEBROX) 6.5 % OTIC solution Place 5 drops into both ears 2 (two) times daily. Patient not taking: Reported on 11/18/2017 07/21/17   Jacqlyn Larsen, PA-C    ALLERGY: Allergies  Allergen Reactions  . Propoxyphene Nausea And Vomiting and Other (See Comments)    Propoxyphene and Acetaminophen  . Demerol  [Meperidine Hcl] Nausea And Vomiting  . Valium [Diazepam] Other (See Comments)    Unknown    Social History   Tobacco Use  . Smoking status: Former Research scientist (life sciences)  . Smokeless tobacco: Never Used  Substance Use Topics  . Alcohol use: No     No family history  on file.   ROS   ROS  Exam   There were no vitals filed for this visit. General appearance: WDWN, NAD Eyes: PERRL, Fundoscopic: normal Cardiovascular: Regular rate and rhythm without murmurs, rubs, gallops. No edema or variciosities. Distal pulses normal. Pulmonary: Clear to auscultation Musculoskeletal:     Muscle tone upper extremities: Normal    Muscle tone lower extremities: Normal    Motor exam: Upper Extremities Deltoid Bicep Tricep Grip  Right 5/5 5/5 5/5 5/5  Left 5/5 5/5 5/5 5/5   Lower Extremity IP Quad PF DF EHL  Right  5/5 5/5 5/5 5/5 5/5  Left 5/5 5/5 5/5 5/5 5/5   Neurological Awake, alert, oriented Memory and concentration grossly intact Speech fluent, appropriate CNII: Visual fields normal CNIII/IV/VI: EOMI CNV: Facial sensation normal CNVII: Symmetric, normal strength CNVIII: Grossly normal CNIX: Normal palate movement CNXI: Trap and SCM strength normal CN XII: Tongue protrusion normal Sensation grossly intact to LT DTR: Normal Coordination (finger/nose & heel/shin): Normal  Results - Imaging/Labs   No results found for this or any previous visit (from the past 48 hour(s)).  No results found.   Impression/Plan   67 y.o. female mild head trauma and post concussive syndrome, with incidentally discovered left internal carotid artery aneurysm.  Based on her age,   It was recommended that she undergo diagnostic cerebral angiogram for further characterization of the aneurysm.  While in the office,  The risks benefits and alternatives to the procedure were discussed at length.  All questions were answered. Patient wishes to proceed.  Consent has been signed.

## 2017-12-30 DIAGNOSIS — Z6825 Body mass index (BMI) 25.0-25.9, adult: Secondary | ICD-10-CM | POA: Diagnosis not present

## 2017-12-30 DIAGNOSIS — I671 Cerebral aneurysm, nonruptured: Secondary | ICD-10-CM | POA: Diagnosis not present

## 2017-12-30 DIAGNOSIS — I10 Essential (primary) hypertension: Secondary | ICD-10-CM | POA: Diagnosis not present

## 2018-01-17 ENCOUNTER — Other Ambulatory Visit: Payer: Self-pay

## 2018-01-17 ENCOUNTER — Emergency Department (HOSPITAL_BASED_OUTPATIENT_CLINIC_OR_DEPARTMENT_OTHER): Payer: PPO

## 2018-01-17 ENCOUNTER — Encounter (HOSPITAL_BASED_OUTPATIENT_CLINIC_OR_DEPARTMENT_OTHER): Payer: Self-pay

## 2018-01-17 ENCOUNTER — Emergency Department (HOSPITAL_BASED_OUTPATIENT_CLINIC_OR_DEPARTMENT_OTHER)
Admission: EM | Admit: 2018-01-17 | Discharge: 2018-01-17 | Disposition: A | Discharge status: Home / Self Care | Payer: PPO | Attending: Emergency Medicine | Admitting: Emergency Medicine

## 2018-01-17 DIAGNOSIS — I10 Essential (primary) hypertension: Secondary | ICD-10-CM | POA: Insufficient documentation

## 2018-01-17 DIAGNOSIS — Z79899 Other long term (current) drug therapy: Secondary | ICD-10-CM | POA: Insufficient documentation

## 2018-01-17 DIAGNOSIS — R109 Unspecified abdominal pain: Secondary | ICD-10-CM | POA: Diagnosis not present

## 2018-01-17 DIAGNOSIS — R110 Nausea: Secondary | ICD-10-CM | POA: Diagnosis not present

## 2018-01-17 DIAGNOSIS — R1032 Left lower quadrant pain: Secondary | ICD-10-CM | POA: Insufficient documentation

## 2018-01-17 DIAGNOSIS — R300 Dysuria: Secondary | ICD-10-CM | POA: Diagnosis not present

## 2018-01-17 DIAGNOSIS — Z87891 Personal history of nicotine dependence: Secondary | ICD-10-CM | POA: Diagnosis not present

## 2018-01-17 HISTORY — DX: Cerebral aneurysm, nonruptured: I67.1

## 2018-01-17 HISTORY — DX: Calculus of kidney: N20.0

## 2018-01-17 LAB — CBC WITH DIFFERENTIAL/PLATELET
BASOS PCT: 1 %
Basophils Absolute: 0 10*3/uL (ref 0.0–0.1)
EOS ABS: 0.1 10*3/uL (ref 0.0–0.7)
EOS PCT: 2 %
HCT: 43.7 % (ref 36.0–46.0)
Hemoglobin: 14.6 g/dL (ref 12.0–15.0)
LYMPHS ABS: 2 10*3/uL (ref 0.7–4.0)
Lymphocytes Relative: 35 %
MCH: 29.7 pg (ref 26.0–34.0)
MCHC: 33.4 g/dL (ref 30.0–36.0)
MCV: 88.8 fL (ref 78.0–100.0)
MONOS PCT: 6 %
Monocytes Absolute: 0.4 10*3/uL (ref 0.1–1.0)
NEUTROS PCT: 56 %
Neutro Abs: 3.2 10*3/uL (ref 1.7–7.7)
PLATELETS: 286 10*3/uL (ref 150–400)
RBC: 4.92 MIL/uL (ref 3.87–5.11)
RDW: 12.6 % (ref 11.5–15.5)
WBC: 5.7 10*3/uL (ref 4.0–10.5)

## 2018-01-17 LAB — URINALYSIS, ROUTINE W REFLEX MICROSCOPIC
Bilirubin Urine: NEGATIVE
Glucose, UA: NEGATIVE mg/dL
HGB URINE DIPSTICK: NEGATIVE
KETONES UR: NEGATIVE mg/dL
Leukocytes, UA: NEGATIVE
NITRITE: NEGATIVE
PH: 6.5 (ref 5.0–8.0)
Protein, ur: NEGATIVE mg/dL
SPECIFIC GRAVITY, URINE: 1.01 (ref 1.005–1.030)

## 2018-01-17 LAB — BASIC METABOLIC PANEL
Anion gap: 9 (ref 5–15)
BUN: 20 mg/dL (ref 8–23)
CALCIUM: 9.5 mg/dL (ref 8.9–10.3)
CO2: 28 mmol/L (ref 22–32)
CREATININE: 0.65 mg/dL (ref 0.44–1.00)
Chloride: 105 mmol/L (ref 98–111)
Glucose, Bld: 86 mg/dL (ref 70–99)
Potassium: 3.8 mmol/L (ref 3.5–5.1)
SODIUM: 142 mmol/L (ref 135–145)

## 2018-01-17 NOTE — ED Notes (Signed)
ED Provider at bedside. 

## 2018-01-17 NOTE — ED Provider Notes (Addendum)
[facility] Provider Note   CSN: [id number] Arrival date & time: 01/17/18  1206     History   Chief Complaint Chief Complaint  Patient presents with  . Flank Pain    HPI Sherry Butler is a 67 y.o. female with a history of brain aneurysm status post IR intervention, depression, hypertension, hyperlipidemia, nephrolithiasis, abdominal hysterectomy, and tubal ligation who presents to the ED with complaints of abdominal pain which started 3 hours PTA.  Patient states the pain started in the left flank/back area and radiated around to the left side of the abdomen.  Patient has been waxing/waning since onset, currently a 2 out of 10 in severity, will escalate to an 8 out of 10 in severity.  Pain seems to be worse when she gets up and moves around.  No specific alleviating factors.  She had some mild associated nausea without vomiting.  She states that this somewhat feels similar to prior kidney stones, most recent stone was 2 years ago and did require surgical intervention.  Denies fever, vomiting, diarrhea, blood in stool, dysuria, urinary frequency/urgency, or hematuria.  HPI  Past Medical History:  Diagnosis Date  . Brain aneurysm   . Depression   . High cholesterol   . Hypertension   . Kidney stone   . Vertigo     Patient Active Problem List   Diagnosis Date Noted  . Left wrist pain 03/28/2017  . HSV-2 (herpes simplex virus 2) infection 10/24/2016  . Bilateral high frequency sensorineural hearing loss 09/04/2016  . OSA on CPAP 08/23/2016  . Essential hypertension 08/23/2016  . Mild depression ([place]) 08/23/2016  . Vertigo 08/23/2016  . Vitamin D deficiency 02/11/2015  . Anxiety 02/11/2015  . Mixed hyperlipidemia 02/11/2015  . Cataract extraction status 11/17/2014  . Osteopenia 11/09/2014  . Calculus of kidney 11/09/2014  . Other seborrheic keratosis 09/30/2014  . Neovascularization, retina 11/05/2013  . Retinal hemorrhage 11/05/2013  .  Retinal macular atrophy 11/05/2013  . Progressive high myopia 11/05/2013  . Nonexudative age-related macular degeneration 11/05/2013  . Low back pain 07/20/2013  . Fatty liver 09/04/2012  . GERD (gastroesophageal reflux disease) 03/06/2012    Past Surgical History:  Procedure Laterality Date  . ABDOMINAL HYSTERECTOMY    . EYE SURGERY    . IR 3D INDEPENDENT WKST  11/22/2017  . IR ANGIO INTRA EXTRACRAN SEL COM CAROTID INNOMINATE UNI R MOD SED  11/22/2017  . IR ANGIO INTRA EXTRACRAN SEL INTERNAL CAROTID BILAT MOD SED  11/22/2017  . IR ANGIO VERTEBRAL SEL VERTEBRAL BILAT MOD SED  11/22/2017  . TUBAL LIGATION       OB History   None      Home Medications    Prior to Admission medications   Medication Sig Start Date End Date Taking? Authorizing Provider  carbamide peroxide (DEBROX) 6.5 % OTIC solution Place 5 drops into both ears 2 (two) times daily. Patient not taking: Reported on 11/18/2017 07/21/17   Jacqlyn Larsen, PA-C  cholecalciferol (VITAMIN D) 1000 units tablet Take 1,000 Units by mouth daily.    [provider]  COD LIVER OIL PO Take 1 capsule by mouth daily.    [provider]  Cyanocobalamin (VITAMIN B-12 PO) Take 1 tablet by mouth daily.    [provider]  escitalopram (LEXAPRO) 10 MG tablet TAKE 1 TABLET BY MOUTH EVERY OTHER DAY Patient taking differently: Take 10 mg by mouth every 4 days 09/27/17   Copland, Gay Filler, MD  Loraine Maple  PO Take 1 capsule by mouth daily.    [provider]  lisinopril (PRINIVIL,ZESTRIL) 10 MG tablet TAKE 1 TABLET BY MOUTH ONCE DAILY 09/27/17   Copland, Gay Filler, MD    Family History No family history on file.  Social History Social History   Tobacco Use  . Smoking status: Former Research scientist (life sciences)  . Smokeless tobacco: Never Used  Substance Use Topics  . Alcohol use: No  . Drug use: Never     Allergies   Propoxyphene; Demerol  [meperidine hcl]; and Valium [diazepam]   Review of Systems Review of Systems    Constitutional: Negative for chills and fever.  Respiratory: Negative for shortness of breath.   Cardiovascular: Negative for chest pain.  Gastrointestinal: Positive for abdominal pain and nausea. Negative for blood in stool, constipation, diarrhea and vomiting.  Genitourinary: Positive for flank pain. Negative for dysuria, frequency, hematuria and urgency.  Neurological: Negative for weakness and numbness.  All other systems reviewed and are negative.  Physical Exam Updated Vital Signs BP (!) 171/85 (BP Location: Left Arm)   Pulse 60   Temp 98.5 F (36.9 C) (Oral)   Resp 18   Ht 5\' 10"  (1.778 m)   Wt 80.7 kg   SpO2 100%   BMI 25.54 kg/m   Physical Exam  Constitutional: She appears well-developed and well-nourished.  Non-toxic appearance. No distress.  HENT:  Head: Normocephalic and atraumatic.  Eyes: Conjunctivae are normal. Right eye exhibits no discharge. Left eye exhibits no discharge.  Neck: Neck supple.  Cardiovascular: Normal rate and regular rhythm.  Pulmonary/Chest: Effort normal and breath sounds normal. No respiratory distress. She has no wheezes. She has no rhonchi. She has no rales.  Respiration even and unlabored  Abdominal: Soft. Normal appearance and bowel sounds are normal. She exhibits no distension and no mass. There is tenderness (mild L abdomen). There is no rebound, no guarding and no CVA tenderness.  Neurological: She is alert.  Clear speech.   Skin: Skin is warm and dry. No rash noted.  Psychiatric: She has a normal mood and affect. Her behavior is normal.  Nursing note and vitals reviewed.  ED Treatments / Results  Labs Results for orders placed or performed during the hospital encounter of 01/17/18  Urinalysis, Routine w reflex microscopic  Result Value Ref Range   Color, Urine YELLOW YELLOW   APPearance CLEAR CLEAR   Specific Gravity, Urine 1.010 1.005 - 1.030   pH 6.5 5.0 - 8.0   Glucose, UA NEGATIVE NEGATIVE mg/dL   Hgb urine dipstick  NEGATIVE NEGATIVE   Bilirubin Urine NEGATIVE NEGATIVE   Ketones, ur NEGATIVE NEGATIVE mg/dL   Protein, ur NEGATIVE NEGATIVE mg/dL   Nitrite NEGATIVE NEGATIVE   Leukocytes, UA NEGATIVE NEGATIVE  CBC with Differential  Result Value Ref Range   WBC 5.7 4.0 - 10.5 K/uL   RBC 4.92 3.87 - 5.11 MIL/uL   Hemoglobin 14.6 12.0 - 15.0 g/dL   HCT 43.7 36.0 - 46.0 %   MCV 88.8 78.0 - 100.0 fL   MCH 29.7 26.0 - 34.0 pg   MCHC 33.4 30.0 - 36.0 g/dL   RDW 12.6 11.5 - 15.5 %   Platelets 286 150 - 400 K/uL   Neutrophils Relative % 56 %   Neutro Abs 3.2 1.7 - 7.7 K/uL   Lymphocytes Relative 35 %   Lymphs Abs 2.0 0.7 - 4.0 K/uL   Monocytes Relative 6 %   Monocytes Absolute 0.4 0.1 - 1.0 K/uL   Eosinophils  Relative 2 %   Eosinophils Absolute 0.1 0.0 - 0.7 K/uL   Basophils Relative 1 %   Basophils Absolute 0.0 0.0 - 0.1 K/uL  Basic metabolic panel  Result Value Ref Range   Sodium 142 135 - 145 mmol/L   Potassium 3.8 3.5 - 5.1 mmol/L   Chloride 105 98 - 111 mmol/L   CO2 28 22 - 32 mmol/L   Glucose, Bld 86 70 - 99 mg/dL   BUN 20 8 - 23 mg/dL   Creatinine, Ser 0.65 0.44 - 1.00 mg/dL   Calcium 9.5 8.9 - 10.3 mg/dL   GFR calc non Af Amer >60 >60 mL/min   GFR calc Af Amer >60 >60 mL/min   Anion gap 9 5 - 15    EKG None  Radiology Dg Abdomen 1 View  Result Date: 01/17/2018 CLINICAL DATA:  Left flank pain and dysuria EXAM: ABDOMEN - 1 VIEW COMPARISON:  None. FINDINGS: The bowel gas pattern is normal. No radio-opaque calculi or other significant radiographic abnormality are seen. IMPRESSION: No radiopaque nephroureterolithiasis. Electronically Signed   By: Ulyses Jarred M.D.   On: 01/17/2018 14:11   US Renal  Result Date: 01/17/2018 CLINICAL DATA:  67 year old female with a history of left flank pain EXAM: RENAL / URINARY TRACT ULTRASOUND COMPLETE COMPARISON:  CT 02/05/2017 FINDINGS: Right Kidney: Length: 11.5 cm. Echogenicity within normal limits. No mass or hydronephrosis visualized.  Extrarenal pelvis Left Kidney: Length: 13.3 cm. Echogenicity within normal limits. No mass or hydronephrosis visualized extrarenal pelvis Bladder: Appears normal for degree of bladder distention. IMPRESSION: No evidence of hydronephrosis. Electronically Signed   By: Corrie Mckusick D.O.   On: 01/17/2018 14:17    Procedures Procedures (including critical care time)  Medications Ordered in ED Medications - No data to display   Initial Impression / Assessment and Plan / ED Course  I have reviewed the triage vital signs and the nursing notes.  Pertinent labs & imaging results that were available during my care of the patient were reviewed by me and considered in my medical decision making (see chart for details).    Patient presents to the emergency department today with complaints of left flank pain radiating to the left lower abdomen.  Patient nontoxic-appearing, no apparent distress, vitals WNL with the exception of elevated blood pressure, improved with repeat vitals, doubt HTN emergency.  She is mildly tender to palpation in the left side of the abdomen.  No peritoneal signs.  She states this does feel somewhat similar to previous kidney stones.  DDX: Nephrolithiasis, pyelonephritis/UTI, muscle strain/spasm, diverticulitis, bowel obstruction/perforation, musuloskeletal, shingles.  Patient reports similar sxs with prior kidney stone. Will evaluate with basic labs as well as KUB and renal ultrasound.  Patient's work-up in the emergency department is grossly unremarkable.  Her labs are completely benign, no leukocytosis, no anemia, no electrolyte disturbance, renal function within normal limits.  Her urinalysis without obvious infection making pyelonephritis unlikely.  KUB does not show any radiopaque stones, no hydronephrosis on ultrasound, in combination with lack of hematuria, feel that nephrolithiasis is less likely. No nausea, vomiting, diarrhea, or constipation, no peritoneal signs, low suspicion  for bowel obstruction/perforation.  Afebrile, no leukocytosis, no diarrhea, doubtful of diverticulitis at this time.  Patient does have a history of prior shingles, this is also a possibility, no vesicular lesions currently , discussed return precautions.  Unclear definitive etiology, patient remains without surgical abdomen, would like to go home, appears hemodynamically stable and safe for discharge with close follow  up. I discussed results, treatment plan, need for PCP follow-up, and return precautions with the patient. Provided opportunity for questions, patient confirmed understanding and is in agreement with plan.   Findings and plan of care discussed with supervising physician Dr. Alvino Chapel who personally evaluated and examined this patient recommendation for discharge home with PCP follow up.   Vitals:   01/17/18 1214 01/17/18 1509  BP: (!) 171/85 (!) 151/84  Pulse: 60   Resp: 18   Temp: 98.5 F (36.9 C)   SpO2: 100% 99%     Final Clinical Impressions(s) / ED Diagnoses   Final diagnoses:  Flank pain    ED Discharge Orders    None       Amaryllis Dyke, PA-C 01/17/18 1818    Amaryllis Dyke, PA-C 01/17/18 1819    Davonna Belling, MD 01/21/18 [phone]

## 2018-01-17 NOTE — ED Triage Notes (Signed)
C/o left flank/abd pain x 1 hour-NAD-steady gait

## 2018-01-17 NOTE — Discharge Instructions (Addendum)
You were seen in the emergency department today for flank/abdominal pain.  Your work-up in the emergency department was overall reassuring.  Your x-ray did not show any kidney stones.  Your ultrasound did not show any fluid coming out of the kidney.  Your labs were all normal, there were no problems with your electrolytes, kidney function, or blood cells.  Urine did not appear infected.  We are unsure the exact cause of your symptoms at this time.  We would like you to call to follow-up closely with your primary care provider within 3 days.  Return to the ER anytime for new or worsening symptoms including but not limited to worsening pain, inability to keep fluids down, blood in your stool, fever, or any other concerns that you may have.  Additionally your blood pressure was elevated in the ER today, please have this rechecked by your primary care doctor at your follow-up appointment.

## 2018-01-17 NOTE — ED Notes (Signed)
Pt ambulatory to BR with steady gate. 

## 2018-01-17 NOTE — ED Notes (Signed)
Patient transported to Xray/US

## 2018-01-23 ENCOUNTER — Inpatient Hospital Stay: Payer: PPO | Admitting: Internal Medicine

## 2018-01-27 NOTE — Progress Notes (Signed)
[place] at [facility] [address] [place], [place] [address] [phone]  Date:  01/29/2018   Name:  Sherry Butler   DOB:  Aug 03, 1950   MRN:  [id number]  PCP:  Darreld Mclean, MD    Chief Complaint: Hospitalization Follow-up (kidney stones? flank pain, taking tylenol, pain comes and goes); Joint Pain (left ankle and foot pain, tops of hand pain); and Nail Problem (left great toe-nail fungus)   History of Present Illness:  Sherry Butler is a 67 y.o. very pleasant female patient who presents with the following:  Hospital follow-up visit today History of OSA, HTN, hyperlipidemia, brain aneurysm treated by IR  Seen in the ER on 8/30 as follows: Sherry Butler is a 67 y.o. female with a history of brain aneurysm status post IR intervention, depression, hypertension, hyperlipidemia, nephrolithiasis, abdominal hysterectomy, and tubal ligation who presents to the ED with complaints of abdominal pain which started 3 hours PTA.  Patient states the pain started in the left flank/back area and radiated around to the left side of the abdomen.  Patient has been waxing/waning since onset, currently a 2 out of 10 in severity, will escalate to an 8 out of 10 in severity.  Pain seems to be worse when she gets up and moves around.  No specific alleviating factors.  She had some mild associated nausea without vomiting.  She states that this somewhat feels similar to prior kidney stones, most recent stone was 2 years ago and did require surgical intervention.  Denies fever, vomiting, diarrhea, blood in stool, dysuria, urinary frequency/urgency, or hematuria.////////////////////////////////////// Patient's work-up in the emergency department is grossly unremarkable.  Her labs are completely benign, no leukocytosis, no anemia, no electrolyte disturbance, renal function within normal limits.  Her urinalysis without obvious infection making pyelonephritis  unlikely.  KUB does not show any radiopaque stones, no hydronephrosis on ultrasound, in combination with lack of hematuria, feel that nephrolithiasis is less likely. No nausea, vomiting, diarrhea, or constipation, no peritoneal signs, low suspicion for bowel obstruction/perforation.  Afebrile, no leukocytosis, no diarrhea, doubtful of diverticulitis at this time.  Patient does have a history of prior shingles, this is also a possibility, no vesicular lesions currently , discussed return precautions.  Unclear definitive etiology, patient remains without surgical abdomen, would like to go home, appears hemodynamically stable and safe for discharge with close follow up. I discussed results, treatment plan, need for PCP follow-up, and return precautions with the patient. Provided opportunity for questions, patient confirmed understanding and is in agreement with plan.   Needs flu, pneumonia and tetanus vaccines- however she does decline today She is getting a CT scan today of her chest to follow-up on a small pulmonary nodule noted on a CT renal stone study a year ago on 02/05/18 Pt notes that she was having left flank pain that was quite severe when she went to the ER She had an episode of more severe pain again 3 days ago She may have pain episodes that last about 10 minute, but not as severe the last couple of days.  More vague discomfort Yesterday she did have some diarrhea but thinks this was due to something that she ate  Diarrhea is resolved now No vomiting  No fever  She also notes a fungal infection of the left great toenail- she had a similar issue on the right but was able to clear it up herself with vicks vapo-rub  Recent labs looked  ok-  Last LFTs done in April  Declines a flu shot today   She is also having some left wrist pain- may have strained it during her work at [employer], which is physically demanding  Patient Active Problem List   Diagnosis Date Noted  . Left wrist pain 03/28/2017   . HSV-2 (herpes simplex virus 2) infection 10/24/2016  . Bilateral high frequency sensorineural hearing loss 09/04/2016  . OSA on CPAP 08/23/2016  . Essential hypertension 08/23/2016  . Mild depression ([place]) 08/23/2016  . Vertigo 08/23/2016  . Vitamin D deficiency 02/11/2015  . Anxiety 02/11/2015  . Mixed hyperlipidemia 02/11/2015  . Cataract extraction status 11/17/2014  . Osteopenia 11/09/2014  . Calculus of kidney 11/09/2014  . Other seborrheic keratosis 09/30/2014  . Neovascularization, retina 11/05/2013  . Retinal hemorrhage 11/05/2013  . Retinal macular atrophy 11/05/2013  . Progressive high myopia 11/05/2013  . Nonexudative age-related macular degeneration 11/05/2013  . Low back pain 07/20/2013  . Fatty liver 09/04/2012  . GERD (gastroesophageal reflux disease) 03/06/2012    Past Medical History:  Diagnosis Date  . Brain aneurysm   . Depression   . High cholesterol   . Hypertension   . Kidney calculi   . Kidney stone   . Vertigo     Past Surgical History:  Procedure Laterality Date  . ABDOMINAL HYSTERECTOMY    . EYE SURGERY    . IR 3D INDEPENDENT WKST  11/22/2017  . IR ANGIO INTRA EXTRACRAN SEL COM CAROTID INNOMINATE UNI R MOD SED  11/22/2017  . IR ANGIO INTRA EXTRACRAN SEL INTERNAL CAROTID BILAT MOD SED  11/22/2017  . IR ANGIO VERTEBRAL SEL VERTEBRAL BILAT MOD SED  11/22/2017  . TUBAL LIGATION      Social History   Tobacco Use  . Smoking status: Former Research scientist (life sciences)  . Smokeless tobacco: Never Used  Substance Use Topics  . Alcohol use: No  . Drug use: Never    History reviewed. No pertinent family history.  Allergies  Allergen Reactions  . Propoxyphene Nausea And Vomiting and Other (See Comments)    Propoxyphene and Acetaminophen  . Demerol  [Meperidine Hcl] Nausea And Vomiting  . Valium [Diazepam] Other (See Comments)    Unknown    Medication list has been reviewed and updated.  Current Outpatient Medications on File Prior to Visit  Medication Sig  Dispense Refill  . cholecalciferol (VITAMIN D) 1000 units tablet Take 1,000 Units by mouth daily.    . COD LIVER OIL PO Take 1 capsule by mouth daily.    . Cyanocobalamin (VITAMIN B-12 PO) Take 1 tablet by mouth daily.    Marland Kitchen escitalopram (LEXAPRO) 10 MG tablet TAKE 1 TABLET BY MOUTH EVERY OTHER DAY (Patient taking differently: Take 10 mg by mouth every 4 days) 15 tablet 5  . GARLIC PO Take 1 capsule by mouth daily.    Marland Kitchen lisinopril (PRINIVIL,ZESTRIL) 10 MG tablet TAKE 1 TABLET BY MOUTH ONCE DAILY 90 tablet 1   No current facility-administered medications on file prior to visit.     Review of Systems:  As per HPI- otherwise negative.   Physical Examination: Vitals:   01/29/18 1137  BP: 124/70  Pulse: 64  Resp: 16  Temp: 98 F (36.7 C)  SpO2: 98%   Vitals:   01/29/18 1137  Weight: 176 lb (79.8 kg)  Height: 5\' 10"  (1.778 m)   Body mass index is 25.25 kg/m. Ideal Body Weight: Weight in (lb) to have BMI = 25: 173.9  GEN: WDWN,  NAD, Non-toxic, A & O x 3, looks well , tall build  HEENT: Atraumatic, Normocephalic. Neck supple. No masses, No LAD.  Bilateral TM wnl, oropharynx normal.  PEERL,EOMI.   Ears and Nose: No external deformity. CV: RRR, No M/G/R. No JVD. No thrill. No extra heart sounds. PULM: CTA B, no wheezes, crackles, rhonchi. No retractions. No resp. distress. No accessory muscle use. ABD: S, NT, ND, +BS. No rebound. No HSM.  Abdomen and flanks are benign at this time  EXTR: No c/c/e NEURO Normal gait.  PSYCH: Normally interactive. Conversant. Not depressed or anxious appearing.  Calm demeanor.  Left great toenail displays mild thickening of the distal portion c/w onychomycosis The left wrist is mildly tender to ROM, but no swelling or bruise.  C/w strain    Assessment and Plan: Left flank pain - Plan: CT RENAL STONE STUDY  Left upper quadrant pain - Plan: CT RENAL STONE STUDY  Sprain of left wrist, initial encounter  Onychomycosis  Lung nodule  Following  up from ER visit about 10 days ago for left flank pain.  She did not have a CT scan, known h/o kidney stones. She would like to have a CT for same today- will arrange She is incidently already having a CT chest to follow-up on a lung nodule seen last year.   For wrist sprain- suggested an OTC brace which she will try Nail fungus- for the time being she prefers to treat cosmetically with nail polish and declines medication  Signed Lamar Blinks, MD Received her renal CT and chest CT from today, gave her a call:   CT abd/ pelvis is benign 1 more annual CT chest is recommended and ordered for her today for 01/2019 CT ABDOMEN AND PELVIS WITHOUT CONTRAST TECHNIQUE: Multidetector CT imaging of the abdomen and pelvis was performed following the standard protocol without IV contrast. COMPARISON:  Radiographs dated 01/17/2018 and CT scan dated 02/05/2017 FINDINGS: Lower chest: No acute abnormality. Hepatobiliary: No focal liver abnormality is seen. No gallstones, gallbladder wall thickening, or biliary dilatation. Pancreas: Unremarkable. No pancreatic ductal dilatation or surrounding inflammatory changes. Spleen: Normal in size without focal abnormality. Adrenals/Urinary Tract: There are 2 tiny stones in the right kidney. Left kidney is normal. No hydronephrosis. No ureteral or bladder calculi. There is a tiny stable calcification in the wall of the bladder seen on image 62 of series 5. The bladder is otherwise normal. Adrenal glands are normal. Stomach/Bowel: Stomach is within normal limits. Appendix appears normal. No evidence of bowel wall thickening, distention, or inflammatory changes. Vascular/Lymphatic: Aortic atherosclerosis. No enlarged abdominal or pelvic lymph nodes. Reproductive: Status post hysterectomy. No adnexal masses. Other: No abdominal wall hernia or abnormality. No abdominopelvic ascites. Musculoskeletal: No acute or significant osseous findings. IMPRESSION: 1. No  acute abnormalities of the abdomen or pelvis. Specifically, no evidence of left ureteral calculi. 2. Small stable stones in the upper and lower poles of the right kidney. 3.  Aortic Atherosclerosis (ICD10-I70.0).   CT CHEST WITHOUT CONTRAST TECHNIQUE: Multidetector CT imaging of the chest was performed following the standard protocol without IV contrast. COMPARISON:  CT scan dated 02/05/2017 FINDINGS: Cardiovascular: Aortic atherosclerosis. Slight prominence of the ascending thoracic aorta to a diameter 3.6 cm. Overall heart size is normal. No pericardial effusion. Mediastinum/Nodes: 14 mm nodule in the left lobe of the thyroid gland. No mediastinal or hilar adenopathy. Esophagus and trachea appear normal. Lungs/Pleura: There is a tiny irregular nodule in the right lower lobe best seen on image 99 of  series 3, now measuring 5 mm,, essentially unchanged from 6 mm on the prior study. There is minimal scarring at the lung apices. There is slight stable scarring in the anteromedial aspect of the right middle lobe with slight linear scarring at both lung bases. Two tiny calcifications in the left lung base are consistent with granulomas. Lungs are otherwise clear. No effusions. Upper Abdomen: Negative. Musculoskeletal: No chest wall mass or suspicious bone lesions identified. IMPRESSION: 1. No significant change in the small irregular nodule in the right lower lobe now with a maximum diameter of 5 mm. One additional 12 month follow-up CT scan without contrast is recommended to determine ongoing stability. 2. No other significant abnormalities.

## 2018-01-28 ENCOUNTER — Telehealth: Payer: Self-pay | Admitting: Medical

## 2018-01-28 DIAGNOSIS — R918 Other nonspecific abnormal finding of lung field: Secondary | ICD-10-CM

## 2018-01-28 DIAGNOSIS — R911 Solitary pulmonary nodule: Secondary | ICD-10-CM

## 2018-01-28 NOTE — Telephone Encounter (Signed)
Notify pt order ct of chest to follow lung nodule which radiologist saw on ct renal stone study one year ago. He recommended to repeat study of chest on one year to follow the nodule in lung. I put in order. Will you coordinate with radiology to get pt scheduled.

## 2018-01-29 ENCOUNTER — Ambulatory Visit (INDEPENDENT_AMBULATORY_CARE_PROVIDER_SITE_OTHER): Payer: PPO | Admitting: Family Medicine

## 2018-01-29 ENCOUNTER — Ambulatory Visit (HOSPITAL_BASED_OUTPATIENT_CLINIC_OR_DEPARTMENT_OTHER)
Admission: RE | Admit: 2018-01-29 | Discharge: 2018-01-29 | Disposition: A | Discharge status: Home / Self Care | Payer: PPO | Source: Ambulatory Visit | Attending: Medical | Admitting: Medical

## 2018-01-29 ENCOUNTER — Encounter: Payer: Self-pay | Admitting: Family Medicine

## 2018-01-29 ENCOUNTER — Ambulatory Visit (HOSPITAL_BASED_OUTPATIENT_CLINIC_OR_DEPARTMENT_OTHER)
Admission: RE | Admit: 2018-01-29 | Discharge: 2018-01-29 | Disposition: A | Discharge status: Home / Self Care | Payer: PPO | Source: Ambulatory Visit | Attending: Family Medicine | Admitting: Family Medicine

## 2018-01-29 VITALS — BP 124/70 | HR 64 | Temp 98.0°F | Resp 16 | Ht 70.0 in | Wt 176.0 lb

## 2018-01-29 DIAGNOSIS — B351 Tinea unguium: Secondary | ICD-10-CM

## 2018-01-29 DIAGNOSIS — R911 Solitary pulmonary nodule: Secondary | ICD-10-CM

## 2018-01-29 DIAGNOSIS — R109 Unspecified abdominal pain: Secondary | ICD-10-CM

## 2018-01-29 DIAGNOSIS — R1012 Left upper quadrant pain: Secondary | ICD-10-CM | POA: Diagnosis not present

## 2018-01-29 DIAGNOSIS — R918 Other nonspecific abnormal finding of lung field: Secondary | ICD-10-CM | POA: Diagnosis not present

## 2018-01-29 DIAGNOSIS — N200 Calculus of kidney: Secondary | ICD-10-CM | POA: Insufficient documentation

## 2018-01-29 DIAGNOSIS — I700 Atherosclerosis of aorta: Secondary | ICD-10-CM | POA: Insufficient documentation

## 2018-01-29 DIAGNOSIS — S63502A Unspecified sprain of left wrist, initial encounter: Secondary | ICD-10-CM

## 2018-01-29 NOTE — Patient Instructions (Addendum)
We will get a CT for kidney stones today as well as your chest CT I will be in touch with this result asap  Try a wrist brace for your left wrist for a few days

## 2018-01-29 NOTE — Telephone Encounter (Signed)
Patient notified at appt today with pcp.

## 2018-03-24 ENCOUNTER — Other Ambulatory Visit: Payer: Self-pay

## 2018-03-30 NOTE — Progress Notes (Deleted)
[place] at [facility] [address] [place], [place] [address] [phone]  Date:  04/02/2018   Name:  Sherry Butler   DOB:  [birth year]   MRN:  [id number]  PCP:  Darreld Mclean, MD    Chief Complaint: No chief complaint on file.   History of Present Illness:  Sherry Butler is a 67 y.o. very pleasant female patient who presents with the following:  Here today with concern of a mole on her arm  Patient Active Problem List   Diagnosis Date Noted  . Left wrist pain 03/28/2017  . HSV-2 (herpes simplex virus 2) infection 10/24/2016  . Bilateral high frequency sensorineural hearing loss 09/04/2016  . OSA on CPAP 08/23/2016  . Essential hypertension 08/23/2016  . Mild depression ([place]) 08/23/2016  . Vertigo 08/23/2016  . Vitamin D deficiency 02/11/2015  . Anxiety 02/11/2015  . Mixed hyperlipidemia 02/11/2015  . Cataract extraction status 11/17/2014  . Osteopenia 11/09/2014  . Calculus of kidney 11/09/2014  . Other seborrheic keratosis 09/30/2014  . Neovascularization, retina 11/05/2013  . Retinal hemorrhage 11/05/2013  . Retinal macular atrophy 11/05/2013  . Progressive high myopia 11/05/2013  . Nonexudative age-related macular degeneration 11/05/2013  . Low back pain 07/20/2013  . Fatty liver 09/04/2012  . GERD (gastroesophageal reflux disease) 03/06/2012    Past Medical History:  Diagnosis Date  . Brain aneurysm   . Depression   . High cholesterol   . Hypertension   . Kidney calculi   . Kidney stone   . Vertigo     Past Surgical History:  Procedure Laterality Date  . ABDOMINAL HYSTERECTOMY    . EYE SURGERY    . IR 3D INDEPENDENT WKST  11/22/2017  . IR ANGIO INTRA EXTRACRAN SEL COM CAROTID INNOMINATE UNI R MOD SED  11/22/2017  . IR ANGIO INTRA EXTRACRAN SEL INTERNAL CAROTID BILAT MOD SED  11/22/2017  . IR ANGIO VERTEBRAL SEL VERTEBRAL BILAT MOD SED  11/22/2017  . TUBAL LIGATION      Social History    Tobacco Use  . Smoking status: Former Research scientist (life sciences)  . Smokeless tobacco: Never Used  Substance Use Topics  . Alcohol use: No  . Drug use: Never    No family history on file.  Allergies  Allergen Reactions  . Propoxyphene Nausea And Vomiting and Other (See Comments)    Propoxyphene and Acetaminophen  . Demerol  [Meperidine Hcl] Nausea And Vomiting  . Valium [Diazepam] Other (See Comments)    Unknown    Medication list has been reviewed and updated.  Current Outpatient Medications on File Prior to Visit  Medication Sig Dispense Refill  . cholecalciferol (VITAMIN D) 1000 units tablet Take 1,000 Units by mouth daily.    . COD LIVER OIL PO Take 1 capsule by mouth daily.    . Cyanocobalamin (VITAMIN B-12 PO) Take 1 tablet by mouth daily.    Marland Kitchen escitalopram (LEXAPRO) 10 MG tablet TAKE 1 TABLET BY MOUTH EVERY OTHER DAY (Patient taking differently: Take 10 mg by mouth every 4 days) 15 tablet 5  . GARLIC PO Take 1 capsule by mouth daily.    Marland Kitchen lisinopril (PRINIVIL,ZESTRIL) 10 MG tablet TAKE 1 TABLET BY MOUTH ONCE DAILY 90 tablet 1   No current facility-administered medications on file prior to visit.     Review of Systems:  As per HPI- otherwise negative.   Physical Examination: There were no vitals filed for this visit. There were no  vitals filed for this visit. There is no height or weight on file to calculate BMI. Ideal Body Weight:    GEN: WDWN, NAD, Non-toxic, A & O x 3 HEENT: Atraumatic, Normocephalic. Neck supple. No masses, No LAD. Ears and Nose: No external deformity. CV: RRR, No M/G/R. No JVD. No thrill. No extra heart sounds. PULM: CTA B, no wheezes, crackles, rhonchi. No retractions. No resp. distress. No accessory muscle use. ABD: S, NT, ND, +BS. No rebound. No HSM. EXTR: No c/c/e NEURO Normal gait.  PSYCH: Normally interactive. Conversant. Not depressed or anxious appearing.  Calm demeanor.    Assessment and Plan: ***  Signed Lamar Blinks, MD

## 2018-04-02 ENCOUNTER — Ambulatory Visit: Payer: PPO | Admitting: Family Medicine

## 2018-04-02 DIAGNOSIS — Z0289 Encounter for other administrative examinations: Secondary | ICD-10-CM

## 2018-04-05 NOTE — Progress Notes (Signed)
[place] at [facility] [address], [place], [place] [address] [phone]  Date:  04/07/2018   Name:  Sherry Butler   DOB:  20-Sep-1950   MRN:  [id number]  PCP:  Darreld Mclean, MD    Chief Complaint: Skin lesion (dark mole of arm, right forearm-changed color, lesion of nose, right side comes and goes, reness)   History of Present Illness:  Sherry Butler is a 67 y.o. very pleasant female patient who presents with the following:  Here today with concern about 2 skin findings She has noted a" scab" on the right side of her nose for a year or so. It will seem to heal up but then comes back again.  It does not bleed but just has not one away  She also has a lesion on her right forearm. Was pink in color, but then seemed to dry out and form a small horn over the last couple of months  She has been under some more stress recently- she is planning to "step back" at work next month.  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  This will be a good change for her but it still hard for her to get used to Also, their neighbors recently took down a big woodpile and disturbed a large colony of rats! Some of the rats entereed their home. They have contracted with an exterminator and she is feeling better but this really freaked her out   Patient Active Problem List   Diagnosis Date Noted  . Left wrist pain 03/28/2017  . HSV-2 (herpes simplex virus 2) infection 10/24/2016  . Bilateral high frequency sensorineural hearing loss 09/04/2016  . OSA on CPAP 08/23/2016  . Essential hypertension 08/23/2016  . Mild depression ([place]) 08/23/2016  . Vertigo 08/23/2016  . Vitamin D deficiency 02/11/2015  . Anxiety 02/11/2015  . Mixed hyperlipidemia 02/11/2015  . Cataract extraction status 11/17/2014  . Osteopenia 11/09/2014  . Calculus of kidney 11/09/2014  . Other seborrheic keratosis 09/30/2014  . Neovascularization,  retina 11/05/2013  . Retinal hemorrhage 11/05/2013  . Retinal macular atrophy 11/05/2013  . Progressive high myopia 11/05/2013  . Nonexudative age-related macular degeneration 11/05/2013  . Low back pain 07/20/2013  . Fatty liver 09/04/2012  . GERD (gastroesophageal reflux disease) 03/06/2012    Past Medical History:  Diagnosis Date  . Brain aneurysm   . Depression   . High cholesterol   . Hypertension   . Kidney calculi   . Kidney stone   . Vertigo     Past Surgical History:  Procedure Laterality Date  . ABDOMINAL HYSTERECTOMY    . EYE SURGERY    . IR 3D INDEPENDENT WKST  11/22/2017  . IR ANGIO INTRA EXTRACRAN SEL COM CAROTID INNOMINATE UNI R MOD SED  11/22/2017  . IR ANGIO INTRA EXTRACRAN SEL INTERNAL CAROTID BILAT MOD SED  11/22/2017  . IR ANGIO VERTEBRAL SEL VERTEBRAL BILAT MOD SED  11/22/2017  . TUBAL LIGATION      Social History   Tobacco Use  . Smoking status: Former Research scientist (life sciences)  . Smokeless tobacco: Never Used  Substance Use Topics  . Alcohol use: No  . Drug use: Never    No family history on file.  Allergies  Allergen Reactions  . Propoxyphene Nausea And Vomiting and Other (See Comments)    Propoxyphene and Acetaminophen  . Demerol  [Meperidine Hcl] Nausea And Vomiting  . Valium [  Diazepam] Other (See Comments)    Unknown    Medication list has been reviewed and updated.  Current Outpatient Medications on File Prior to Visit  Medication Sig Dispense Refill  . cholecalciferol (VITAMIN D) 1000 units tablet Take 1,000 Units by mouth daily.    . COD LIVER OIL PO Take 1 capsule by mouth daily.    . Cyanocobalamin (VITAMIN B-12 PO) Take 1 tablet by mouth daily.    Marland Kitchen GARLIC PO Take 1 capsule by mouth daily.    Marland Kitchen lisinopril (PRINIVIL,ZESTRIL) 10 MG tablet TAKE 1 TABLET BY MOUTH ONCE DAILY 90 tablet 1   No current facility-administered medications on file prior to visit.     Review of Systems:  As per HPI- otherwise negative.   Physical Examination: Vitals:    04/07/18 1048  BP: (!) 142/70  Pulse: 61  Resp: 16  Temp: 98.4 F (36.9 C)  SpO2: 95%   Vitals:   04/07/18 1048  Weight: 180 lb (81.6 kg)  Height: 5\' 10"  (1.778 m)   Body mass index is 25.83 kg/m. Ideal Body Weight: Weight in (lb) to have BMI = 25: 173.9  GEN: WDWN, NAD, Non-toxic, A & O x 3, looks well, normal weight  HEENT: Atraumatic, Normocephalic. Neck supple. No masses, No LAD. Ears and Nose: No external deformity. CV: RRR, No M/G/R. No JVD. No thrill. No extra heart sounds. PULM: CTA B, no wheezes, crackles, rhonchi. No retractions. No resp. distress. No accessory muscle use. EXTR: No c/c/e NEURO Normal gait.  PSYCH: Normally interactive. Conversant. Not depressed or anxious appearing.  Calm demeanor.  She has a tiny erythematous spot on the right nostril On the right forearm is an approx 2-3 mm diameter horny lesion-   VC obtained Cryo x3 to horny lesion on right arm   Assessment and Plan: Skin lesion of face - Plan: Ambulatory referral to Dermatology  Skin lesion of right arm - Plan: Ambulatory referral to Dermatology  Anxiety - Plan: escitalopram (LEXAPRO) 10 MG tablet  Skin lesion- cryo to the one lesion on her arm. Referral to derm for possible skin ca on her nose She is taking lexapro for her anxiety but often just takes it every few days  she plans to take this a bit more frequently. She denies any SI, and is feeling better about her change at work now that she is getting used to the idea.  They also have a plan for tackling the rat issue   See patient instructions for more details.       Signed Lamar Blinks, MD

## 2018-04-07 ENCOUNTER — Ambulatory Visit (INDEPENDENT_AMBULATORY_CARE_PROVIDER_SITE_OTHER): Payer: PPO | Admitting: Family Medicine

## 2018-04-07 ENCOUNTER — Encounter: Payer: Self-pay | Admitting: Family Medicine

## 2018-04-07 VITALS — BP 142/70 | HR 61 | Temp 98.4°F | Resp 16 | Ht 70.0 in | Wt 180.0 lb

## 2018-04-07 DIAGNOSIS — L989 Disorder of the skin and subcutaneous tissue, unspecified: Secondary | ICD-10-CM | POA: Diagnosis not present

## 2018-04-07 DIAGNOSIS — F419 Anxiety disorder, unspecified: Secondary | ICD-10-CM

## 2018-04-07 MED ORDER — ESCITALOPRAM OXALATE 10 MG PO TABS: 10.0000 mg | ORAL_TABLET | ORAL | 3 refills | Status: DC

## 2018-04-07 NOTE — Patient Instructions (Addendum)
We froze the lesion on your right arm today- however, if this does not go away please show it to your dermatologist. I am going to set you up to see dermatology regarding the finding on the side of your nose. I am concerned that this could be a skin cancer due to it's location and your description   Let me know if your anxiety if not improved soon- I am so sorry about this rat issue

## 2018-04-22 DIAGNOSIS — J111 Influenza due to unidentified influenza virus with other respiratory manifestations: Secondary | ICD-10-CM | POA: Diagnosis not present

## 2018-04-22 NOTE — Progress Notes (Deleted)
[place] at [facility] [address] [place], [place] [address] [phone]  Date:  04/23/2018   Name:  Sherry Butler   DOB:  1950/11/08   MRN:  [id number]  PCP:  Darreld Mclean, MD    Chief Complaint: No chief complaint on file.   History of Present Illness:  Sherry Butler is a 67 y.o. very pleasant female patient who presents with the following:  Here today for a sick visit History of hyperlipidemia, OSA,HTN  Patient Active Problem List   Diagnosis Date Noted  . Left wrist pain 03/28/2017  . HSV-2 (herpes simplex virus 2) infection 10/24/2016  . Bilateral high frequency sensorineural hearing loss 09/04/2016  . OSA on CPAP 08/23/2016  . Essential hypertension 08/23/2016  . Mild depression ([place]) 08/23/2016  . Vertigo 08/23/2016  . Vitamin D deficiency 02/11/2015  . Anxiety 02/11/2015  . Mixed hyperlipidemia 02/11/2015  . Cataract extraction status 11/17/2014  . Osteopenia 11/09/2014  . Calculus of kidney 11/09/2014  . Other seborrheic keratosis 09/30/2014  . Neovascularization, retina 11/05/2013  . Retinal hemorrhage 11/05/2013  . Retinal macular atrophy 11/05/2013  . Progressive high myopia 11/05/2013  . Nonexudative age-related macular degeneration 11/05/2013  . Low back pain 07/20/2013  . Fatty liver 09/04/2012  . GERD (gastroesophageal reflux disease) 03/06/2012    Past Medical History:  Diagnosis Date  . Brain aneurysm   . Depression   . High cholesterol   . Hypertension   . Kidney calculi   . Kidney stone   . Vertigo     Past Surgical History:  Procedure Laterality Date  . ABDOMINAL HYSTERECTOMY    . EYE SURGERY    . IR 3D INDEPENDENT WKST  11/22/2017  . IR ANGIO INTRA EXTRACRAN SEL COM CAROTID INNOMINATE UNI R MOD SED  11/22/2017  . IR ANGIO INTRA EXTRACRAN SEL INTERNAL CAROTID BILAT MOD SED  11/22/2017  . IR ANGIO VERTEBRAL SEL VERTEBRAL BILAT MOD SED  11/22/2017  . TUBAL LIGATION       Social History   Tobacco Use  . Smoking status: Former Research scientist (life sciences)  . Smokeless tobacco: Never Used  Substance Use Topics  . Alcohol use: No  . Drug use: Never    No family history on file.  Allergies  Allergen Reactions  . Propoxyphene Nausea And Vomiting and Other (See Comments)    Propoxyphene and Acetaminophen  . Demerol  [Meperidine Hcl] Nausea And Vomiting  . Valium [Diazepam] Other (See Comments)    Unknown    Medication list has been reviewed and updated.  Current Outpatient Medications on File Prior to Visit  Medication Sig Dispense Refill  . cholecalciferol (VITAMIN D) 1000 units tablet Take 1,000 Units by mouth daily.    . COD LIVER OIL PO Take 1 capsule by mouth daily.    . Cyanocobalamin (VITAMIN B-12 PO) Take 1 tablet by mouth daily.    Marland Kitchen escitalopram (LEXAPRO) 10 MG tablet Take 1 tablet (10 mg total) by mouth every other day. 45 tablet 3  . GARLIC PO Take 1 capsule by mouth daily.    Marland Kitchen lisinopril (PRINIVIL,ZESTRIL) 10 MG tablet TAKE 1 TABLET BY MOUTH ONCE DAILY 90 tablet 1   No current facility-administered medications on file prior to visit.     Review of Systems:  As per HPI- otherwise negative.   Physical Examination: There were no vitals filed for this visit. There were no vitals filed for this visit. There is no  height or weight on file to calculate BMI. Ideal Body Weight:    GEN: WDWN, NAD, Non-toxic, A & O x 3 HEENT: Atraumatic, Normocephalic. Neck supple. No masses, No LAD. Ears and Nose: No external deformity. CV: RRR, No M/G/R. No JVD. No thrill. No extra heart sounds. PULM: CTA B, no wheezes, crackles, rhonchi. No retractions. No resp. distress. No accessory muscle use. ABD: S, NT, ND, +BS. No rebound. No HSM. EXTR: No c/c/e NEURO Normal gait.  PSYCH: Normally interactive. Conversant. Not depressed or anxious appearing.  Calm demeanor.    Assessment and Plan: ***  Signed Lamar Blinks, MD

## 2018-04-23 ENCOUNTER — Ambulatory Visit: Payer: PPO | Admitting: Family Medicine

## 2018-04-23 ENCOUNTER — Encounter

## 2018-05-01 DIAGNOSIS — D485 Neoplasm of uncertain behavior of skin: Secondary | ICD-10-CM | POA: Diagnosis not present

## 2018-05-01 DIAGNOSIS — C44311 Basal cell carcinoma of skin of nose: Secondary | ICD-10-CM | POA: Diagnosis not present

## 2018-05-01 DIAGNOSIS — L720 Epidermal cyst: Secondary | ICD-10-CM | POA: Diagnosis not present

## 2018-05-16 ENCOUNTER — Ambulatory Visit: Payer: Self-pay

## 2018-05-16 NOTE — Telephone Encounter (Signed)
Called pt. Back to discuss her difficulty with breathing at night.  Stated she has been on CPAP in past, but has not used it in approx. 6 mos.  Reported she needs to replace some of the equipment, and has attempted to do so, but never receives return call from the company that supplies this.  Stated she has not seen her ENT doctor for this condition for at least 3 years.  Reported that since he is in [place], she wants to start all over, with someone local.  Reported she wakes up several times/night, and some of those times, she is gasping for air.  At present, denied any ill feeling.  Stated she still has a stuffy nose, from previous cold or flu, but feels she is getting better.  Appt. scheduled with PCP 05/19/18.  Pt. Agrees with plan.            Reason for Disposition . [1] MILD longstanding difficulty breathing AND [2]  SAME as normal    Reported diagnosed with Sleep apnea about 4 yrs. Ago.  Has not used her CPAP in about 6 mos.;  Requesting to get new equipment.  Answer Assessment - Initial Assessment Questions 1. RESPIRATORY STATUS: "Describe your breathing?" (e.g., wheezing, shortness of breath, unable to speak, severe coughing)      She wakes up gasping for air.   2. ONSET: "When did this breathing problem begin?"      Diagnosed with Sleep Apnea approx. 4 yrs. Ago  3. PATTERN "Does the difficult breathing come and go, or has it been constant since it started?"      Intermittent; awakens 4-5 times /night  4. SEVERITY: "How bad is your breathing?" (e.g., mild, moderate, severe)    - MILD: No SOB at rest, mild SOB with walking, speaks normally in sentences, can lay down, no retractions, pulse < 100.    - MODERATE: SOB at rest, SOB with minimal exertion and prefers to sit, cannot lie down flat, speaks in phrases, mild retractions, audible wheezing, pulse 100-120.    - SEVERE: Very SOB at rest, speaks in single words, struggling to breathe, sitting hunched forward, retractions, pulse > 120       Mild to moderate 5. RECURRENT SYMPTOM: "Have you had difficulty breathing before?" If so, ask: "When was the last time?" and "What happened that time?"      Yes; has sleep apnea; has not used CPAP for 6 mos.  6. CARDIAC HISTORY: "Do you have any history of heart disease?" (e.g., heart attack, angina, bypass surgery, angioplasty)      Denied 7. LUNG HISTORY: "Do you have any history of lung disease?"  (e.g., pulmonary embolus, asthma, emphysema)     Sleep apnea 8. CAUSE: "What do you think is causing the breathing problem?"      Sleep apnea  9. OTHER SYMPTOMS: "Do you have any other symptoms? (e.g., dizziness, runny nose, cough, chest pain, fever)     Denied shortness of breath; nasal stuffiness  10. PREGNANCY: "Is there any chance you are pregnant?" "When was your last menstrual period?"       N/a  11. TRAVEL: "Have you traveled out of the country in the last month?" (e.g., travel history, exposures)       n/a  Protocols used: BREATHING DIFFICULTY-A-AH  Message from Rayann Heman sent at 05/16/2018 2:33 PM EST   Summary: stops breathing when sleeping    Pt called and stated that he husband says that she quits breathing  at night when she is sleeping. Pt would like a call back regarding. Please advise

## 2018-05-17 NOTE — Progress Notes (Signed)
[place] at [facility] [address] [place], [place] [address] [phone]  Date:  05/19/2018   Name:  Sherry Butler   DOB:  [birth year]   MRN:  [id number]  PCP:  Darreld Mclean, MD    Chief Complaint: Sleep Apnea (needing new equipment, hose and mask, waking up gasping for air) and Dizziness (feels "swimmy headed", feels off, ear pressure)   History of Present Illness:  Sherry Butler is a 67 y.o. very pleasant female patient who presents with the following:  Patient with history of hypertension, sleep apnea on CPAP, fatty liver, hyperlipidemia, eye disease. Last seen by myself in November, with concern of a facial skin lesion-I had advised her to visit dermatology, as I was concerned about a possible skin cancer on her nose  At last visit Sherry Butler was under more stress.  She was planning to decrease her responsibilities at work, as a step towards retirement.  This is a good change but it was still stressful. In addition, some neighbors had dismantled a large woodpile which expelled a large colony of rats.  These rats were over running the property, which was very stressful She had been using Lexapro just on occasion, and I encouraged her to take it on a regular basis to help with her increased stress  She had a hard Christmas- her 4 kids pretty much ignored her and did not even call.  She is feeling out of sorts and upset.  She does not feel anxious but is sad . No SI   See nurse triage note from December 27.  Sherry Butler had called in with concern of waking up at night gasping for air, and wondered about going back on CPAP.  She had used CPAP in the past, but needs some new equipment Her husband has noted that she stops breathing at night She went to the medical supply store to get a new hose; she was told that she owed money and tried to pay, but they would not take her money  [facility] manages her [place]    Her sleep study was through an ENT practice in [place] - she thinks this was done 4-5 years ago.  She is not sure if the practice that did her sleep study is still in business.  [facility] CPAP [facility] through [facility], and she went to their storefront in [place] to try and get a new hose and mask.  However she was told that she noted the company $300, which she was not aware of.  Sherry Butler tried to pay but for some reason they were not able to take her payment  Tetanus? Pneumonia shot? Sherry Butler is also stressed because she is worried by getting the flu.  After some discussion she is willing to get a flu shot She has typically declined immunizations Flu shot- will do today   Her husband is here today and contributes to the history  Patient Active Problem List   Diagnosis Date Noted  . Left wrist pain 03/28/2017  . HSV-2 (herpes simplex virus 2) infection 10/24/2016  . Bilateral high frequency sensorineural hearing loss 09/04/2016  . OSA on CPAP 08/23/2016  . Essential hypertension 08/23/2016  . Mild depression ([place]) 08/23/2016  . Vertigo 08/23/2016  . Vitamin D deficiency 02/11/2015  . Anxiety 02/11/2015  . Mixed hyperlipidemia 02/11/2015  . Cataract extraction status 11/17/2014  . Osteopenia 11/09/2014  . Calculus of kidney 11/09/2014  .  Other seborrheic keratosis 09/30/2014  . Neovascularization, retina 11/05/2013  . Retinal hemorrhage 11/05/2013  . Retinal macular atrophy 11/05/2013  . Progressive high myopia 11/05/2013  . Nonexudative age-related macular degeneration 11/05/2013  . Low back pain 07/20/2013  . Fatty liver 09/04/2012  . GERD (gastroesophageal reflux disease) 03/06/2012    Past Medical History:  Diagnosis Date  . Brain aneurysm   . Depression   . High cholesterol   . Hypertension   . Kidney calculi   . Kidney stone   . Vertigo     Past Surgical History:  Procedure Laterality Date  . ABDOMINAL HYSTERECTOMY    . EYE SURGERY    . IR 3D  INDEPENDENT WKST  11/22/2017  . IR ANGIO INTRA EXTRACRAN SEL COM CAROTID INNOMINATE UNI R MOD SED  11/22/2017  . IR ANGIO INTRA EXTRACRAN SEL INTERNAL CAROTID BILAT MOD SED  11/22/2017  . IR ANGIO VERTEBRAL SEL VERTEBRAL BILAT MOD SED  11/22/2017  . TUBAL LIGATION      Social History   Tobacco Use  . Smoking status: Former Research scientist (life sciences)  . Smokeless tobacco: Never Used  Substance Use Topics  . Alcohol use: No  . Drug use: Never    History reviewed. No pertinent family history.  Allergies  Allergen Reactions  . Propoxyphene Nausea And Vomiting and Other (See Comments)    Propoxyphene and Acetaminophen  . Demerol  [Meperidine Hcl] Nausea And Vomiting  . Valium [Diazepam] Other (See Comments)    Unknown    Medication list has been reviewed and updated.  Current Outpatient Medications on File Prior to Visit  Medication Sig Dispense Refill  . cholecalciferol (VITAMIN D) 1000 units tablet Take 1,000 Units by mouth daily.    . COD LIVER OIL PO Take 1 capsule by mouth daily.    . Cyanocobalamin (VITAMIN B-12 PO) Take 1 tablet by mouth daily.    Marland Kitchen escitalopram (LEXAPRO) 10 MG tablet Take 1 tablet (10 mg total) by mouth every other day. 45 tablet 3  . GARLIC PO Take 1 capsule by mouth daily.    Marland Kitchen lisinopril (PRINIVIL,ZESTRIL) 10 MG tablet TAKE 1 TABLET BY MOUTH ONCE DAILY 90 tablet 1   No current facility-administered medications on file prior to visit.     Review of Systems:  As per HPI- otherwise negative. No fever or chills    Physical Examination: Vitals:   05/19/18 1557  BP: 136/90  Pulse: 62  Resp: 16  SpO2: 98%   Vitals:   05/19/18 1557  Weight: 178 lb (80.7 kg)  Height: 5\' 10"  (1.778 m)   Body mass index is 25.54 kg/m. Ideal Body Weight: Weight in (lb) to have BMI = 25: 173.9  GEN: WDWN, NAD, Non-toxic, A & O x 3, looks well, normal weight  HEENT: Atraumatic, Normocephalic. Neck supple. No masses, No LAD. Ears and Nose: No external deformity. CV: RRR, No M/G/R. No  JVD. No thrill. No extra heart sounds. PULM: CTA B, no wheezes, crackles, rhonchi. No retractions. No resp. distress. No accessory muscle use. EXTR: No c/c/e NEURO Normal gait.  PSYCH: Normally interactive. Conversant. Not depressed or anxious appearing.  Calm demeanor.    Assessment and Plan: Situational depression  Need for influenza vaccination - Plan: Flu Vaccine QUAD 6+ mos PF IM (Fluarix Quad PF)  OSA on CPAP  Sherry Butler is here with a couple of concerns today.  She did decide have a flu shot today, which I am happy about She has been under a lot  of stress, with changes at her job, the ra rat problem at home, and then most recently a very disappointing Christmas.  She reports that none of her 4 children, nor her many grandchildren acknowledged her at all She declines increase her Lexapro at this time, she thinks that she will be able to get through this. Expressed empathy with her situation.  We discussed that perhaps she might call her children and, and calmly explain that their lack of contact was very hurtful Her husband is supportive  We have called [facility] and tried to get through on her behalf.  So far this is not been successful, I will try again  Signed Lamar Blinks, MD

## 2018-05-19 ENCOUNTER — Ambulatory Visit (INDEPENDENT_AMBULATORY_CARE_PROVIDER_SITE_OTHER): Payer: PPO | Admitting: Family Medicine

## 2018-05-19 ENCOUNTER — Encounter: Payer: Self-pay | Admitting: Family Medicine

## 2018-05-19 VITALS — BP 136/90 | HR 62 | Resp 16 | Ht 70.0 in | Wt 178.0 lb

## 2018-05-19 DIAGNOSIS — Z9989 Dependence on other enabling machines and devices: Secondary | ICD-10-CM

## 2018-05-19 DIAGNOSIS — F4321 Adjustment disorder with depressed mood: Secondary | ICD-10-CM

## 2018-05-19 DIAGNOSIS — G4733 Obstructive sleep apnea (adult) (pediatric): Secondary | ICD-10-CM | POA: Diagnosis not present

## 2018-05-19 DIAGNOSIS — Z23 Encounter for immunization: Secondary | ICD-10-CM | POA: Diagnosis not present

## 2018-05-19 NOTE — Patient Instructions (Addendum)
You got your flu shot today We will work together on getting you set up for your CPAP supplies  Let me know if you are not feeling back to your normal self coming up soon

## 2018-05-20 ENCOUNTER — Telehealth: Payer: Self-pay | Admitting: Family Medicine

## 2018-05-20 NOTE — Telephone Encounter (Signed)
-----   Message from Darreld Mclean, MD sent at 05/19/2018  5:33 PM EST ----- [facility]

## 2018-05-20 NOTE — Telephone Encounter (Signed)
Called [facility] in [place] for her. The person I spoke to is going to call her and help her get all straightened out.  I gave her my fax number so we can re-order her supplies for her

## 2018-05-21 DIAGNOSIS — C801 Malignant (primary) neoplasm, unspecified: Secondary | ICD-10-CM

## 2018-05-21 HISTORY — DX: Malignant (primary) neoplasm, unspecified: C80.1

## 2018-05-22 ENCOUNTER — Telehealth: Payer: Self-pay

## 2018-05-22 DIAGNOSIS — G4733 Obstructive sleep apnea (adult) (pediatric): Secondary | ICD-10-CM | POA: Diagnosis not present

## 2018-05-22 NOTE — Telephone Encounter (Signed)
Copied from [place] [id number]. Topic: General - Other >> May 20, 2018  1:11 PM Sherry Butler, Valda Favia wrote: Patient called in and stated her supplies for her breathing machine will arrive between 10 days and 2 weeks and she thanks everyone for the help

## 2018-05-22 NOTE — Telephone Encounter (Signed)
Great!

## 2018-05-28 ENCOUNTER — Telehealth: Payer: Self-pay | Admitting: *Deleted

## 2018-05-28 NOTE — Telephone Encounter (Signed)
Received Physician Orders from [place] Team; forwarded to provider/SLS 01/08

## 2018-06-16 ENCOUNTER — Other Ambulatory Visit: Payer: Self-pay | Admitting: Family Medicine

## 2018-06-26 DIAGNOSIS — C44311 Basal cell carcinoma of skin of nose: Secondary | ICD-10-CM | POA: Diagnosis not present

## 2018-06-27 ENCOUNTER — Encounter: Payer: Self-pay | Admitting: Medical

## 2018-06-27 ENCOUNTER — Ambulatory Visit (INDEPENDENT_AMBULATORY_CARE_PROVIDER_SITE_OTHER): Payer: PPO | Admitting: Medical

## 2018-06-27 VITALS — BP 163/87 | HR 59 | Temp 98.3°F | Resp 16 | Ht 70.0 in | Wt 177.4 lb

## 2018-06-27 DIAGNOSIS — I10 Essential (primary) hypertension: Secondary | ICD-10-CM | POA: Diagnosis not present

## 2018-06-27 DIAGNOSIS — G44209 Tension-type headache, unspecified, not intractable: Secondary | ICD-10-CM | POA: Diagnosis not present

## 2018-06-27 LAB — COMPREHENSIVE METABOLIC PANEL
ALBUMIN: 4.5 g/dL (ref 3.5–5.2)
ALK PHOS: 100 U/L (ref 39–117)
ALT: 18 U/L (ref 0–35)
AST: 18 U/L (ref 0–37)
BUN: 18 mg/dL (ref 6–23)
CO2: 30 mEq/L (ref 19–32)
CREATININE: 0.73 mg/dL (ref 0.40–1.20)
Calcium: 9.9 mg/dL (ref 8.4–10.5)
Chloride: 104 mEq/L (ref 96–112)
GFR: 79.32 mL/min (ref >60.00)
Glucose, Bld: 85 mg/dL (ref 70–99)
POTASSIUM: 5.1 meq/L (ref 3.5–5.1)
SODIUM: 141 meq/L (ref 135–145)
TOTAL PROTEIN: 7.5 g/dL (ref 6.0–8.3)
Total Bilirubin: 0.8 mg/dL (ref 0.2–1.2)

## 2018-06-27 MED ORDER — CHLORTHALIDONE 25 MG PO TABS: 25.0000 mg | ORAL_TABLET | Freq: Every day | ORAL | 0 refills | Status: DC

## 2018-06-27 MED ORDER — CYCLOBENZAPRINE HCL 5 MG PO TABS: 5.0000 mg | ORAL_TABLET | Freq: Every day | ORAL | 0 refills | Status: DC

## 2018-06-27 NOTE — Patient Instructions (Signed)
Your blood pressure was very high at the surgical center recently, better yesterday but moderately elevated again today.  You do have history of hypertension but also some white coat component.  Recently high level of stress associated with the surgery.  You had normal neurologic exam with no deficits today.  I want you to continue with lisinopril over the weekend and I am adding chlorthalidone.  I do want you to get a blood pressure machine over-the-counter and check your blood pressures daily.  If you have blood pressure drop to level less than 130/80 then just use lisinopril.  I do want you to check your blood pressures over the weekend and schedule follow-up on Monday so I can recheck the blood pressure and review your readings this weekend.  Please get metabolic panel today.  If you have worse/recurrent headache or any neurologic or motor signs symptoms/deficits then recommend ED evaluation.  You do have some left trapezius tenderness presently and this makes me think that you have some component of tension headache presently.  You can use Tylenol for headache and Flexeril at night.  Hopefully this will decrease headache level.  You could go ahead and take Flexeril early this afternoon but make sure that you plan to stay at home and no driving.  Follow-up on Monday or as needed.

## 2018-06-27 NOTE — Progress Notes (Signed)
Subjective:    Patient ID: Sherry Butler, female    DOB: [birth year], 68 y.o.   MRN: [id number]  HPI   Pt in for follow up on her blood pressure.  Her blood pressure is elevated today.   Pt states yesterday at surgical center for bp was 220/114. Pt has hx of getting nervous in office. She states was nervous before procedure. She surgery on side of her nose and needed piece of ear for reconstruction.  Pt bp yesterday at [facility] later in day bp was 147/87. Then later bp was 143/94.  She states this morning when woke up had sever ha. Lamonte Sakai is less now.  She Tuesday and Wednesday did not sleep well since she was very nervous.  Pt states tense for 2 days. She notes left side trapezius tenderness  Ha level presently 5/10.   Review of Systems  Constitutional: Negative for chills, fatigue and fever.  Respiratory: Negative for cough, chest tightness, shortness of breath and wheezing.   Cardiovascular: Negative for chest pain and palpitations.  Gastrointestinal: Negative for abdominal pain.  Musculoskeletal: Negative for back pain.       Lt side trapezius tenderness.  Neurological: Positive for headaches. Negative for dizziness, speech difficulty, weakness and light-headedness.  Hematological: Negative for adenopathy. Does not bruise/bleed easily.  Psychiatric/Behavioral: Negative for behavioral problems, decreased concentration, dysphoric mood and sleep disturbance. The patient is nervous/anxious.        Objective:   Physical Exam  General Mental Status- Alert. General Appearance- Not in acute distress.   Skin General: Color- Normal Color. Moisture- Normal Moisture.  Neck Carotid Arteries- Normal color. Moisture- Normal Moisture. No carotid bruits. No JVD. Lt side trapezius tender to palpation.  Chest and Lung Exam Auscultation: Breath Sounds:-Normal.  Cardiovascular Auscultation:Rythm- Regular. Murmurs & Other Heart Sounds:Auscultation of the heart reveals- No  Murmurs.  Abdomen Inspection:-Inspeection Normal. Palpation/Percussion:Note:No mass. Palpation and Percussion of the abdomen reveal- Non Tender, Non Distended + BS, no rebound or guarding.   Neurologic Cranial Nerve exam:- CN III-XII intact(No nystagmus), symmetric smile. Drift Test:- No drift. Romberg Exam:- Negative.  Heal to Toe Gait exam:-mild difficulty but passed. Finger to Nose:- Normal/Intact Strength:- 5/5 equal and symmetric strength both upper and lower extremities.      Assessment & Plan:  Your blood pressure was very high at the surgical center recently, better yesterday but moderately elevated again today.  You do have history of hypertension but also some white coat component.  Recently high level of stress associated with the surgery.  You had normal neurologic exam with no deficits today.  I want you to continue with lisinopril over the weekend and I am adding chlorthalidone.  I do want you to get a blood pressure machine over-the-counter and check your blood pressures daily.  If you have blood pressure drop to level less than 130/80 then just use lisinopril.  I do want you to check your blood pressures over the weekend and schedule follow-up on Monday so I can recheck the blood pressure and review your readings this weekend.  Please get metabolic panel today.  If you have worse/recurrent headache or any neurologic or motor signs symptoms/deficits then recommend ED evaluation.  You do have some left trapezius tenderness presently and this makes me think that you have some component of tension headache presently.  You can use Tylenol for headache and Flexeril at night.  Hopefully this will decrease headache level.  You could go ahead and take Flexeril early this afternoon but  make sure that you plan to stay at home and no driving.  Follow-up on Monday or as needed.  40  Minutes spent with pt. 50% of time spent couseling on treatment  plan going forward in light of severe  high bp pressure yesterday and upcoming weekend.

## 2018-06-28 NOTE — Progress Notes (Deleted)
[place] at [facility] [address], [place], [place] [address] [phone]  Date:  06/30/2018   Name:  Sherry Butler   DOB:  09-02-1950   MRN:  [id number]  PCP:  Darreld Mclean, MD    Chief Complaint: No chief complaint on file.   History of Present Illness:  Sherry Butler is a 68 y.o. very pleasant female patient who presents with the following:  Last visit with myself in December, she has history of hypertension and sleep apnea.  At that time her blood pressure was controlled on 10 mg of lisinopril Here today for follow-up visit-she was seen on 06/27/2018 by Percell Miller She had been in the surgical center the day prior to have some surgery on her nose, and her blood pressure was very high.  It was found to be 220/114 Percell Miller had her continue her lisinopril and added chlorthalidone  BP Readings from Last 3 Encounters:  06/27/18 (!) 163/87  05/19/18 136/90  04/07/18 (!) 142/70     Patient Active Problem List   Diagnosis Date Noted  . Left wrist pain 03/28/2017  . HSV-2 (herpes simplex virus 2) infection 10/24/2016  . Bilateral high frequency sensorineural hearing loss 09/04/2016  . OSA on CPAP 08/23/2016  . Essential hypertension 08/23/2016  . Mild depression ([place]) 08/23/2016  . Vertigo 08/23/2016  . Vitamin D deficiency 02/11/2015  . Anxiety 02/11/2015  . Mixed hyperlipidemia 02/11/2015  . Cataract extraction status 11/17/2014  . Osteopenia 11/09/2014  . Calculus of kidney 11/09/2014  . Other seborrheic keratosis 09/30/2014  . Neovascularization, retina 11/05/2013  . Retinal hemorrhage 11/05/2013  . Retinal macular atrophy 11/05/2013  . Progressive high myopia 11/05/2013  . Nonexudative age-related macular degeneration 11/05/2013  . Low back pain 07/20/2013  . Fatty liver 09/04/2012  . GERD (gastroesophageal reflux disease) 03/06/2012    Past Medical History:  Diagnosis Date  . Brain aneurysm   .  Depression   . High cholesterol   . Hypertension   . Kidney calculi   . Kidney stone   . Vertigo     Past Surgical History:  Procedure Laterality Date  . ABDOMINAL HYSTERECTOMY    . EYE SURGERY    . IR 3D INDEPENDENT WKST  11/22/2017  . IR ANGIO INTRA EXTRACRAN SEL COM CAROTID INNOMINATE UNI R MOD SED  11/22/2017  . IR ANGIO INTRA EXTRACRAN SEL INTERNAL CAROTID BILAT MOD SED  11/22/2017  . IR ANGIO VERTEBRAL SEL VERTEBRAL BILAT MOD SED  11/22/2017  . TUBAL LIGATION      Social History   Tobacco Use  . Smoking status: Former Research scientist (life sciences)  . Smokeless tobacco: Never Used  Substance Use Topics  . Alcohol use: No  . Drug use: Never    No family history on file.  Allergies  Allergen Reactions  . Propoxyphene Nausea And Vomiting and Other (See Comments)    Propoxyphene and Acetaminophen  . Demerol  [Meperidine Hcl] Nausea And Vomiting  . Valium [Diazepam] Other (See Comments)    Unknown    Medication list has been reviewed and updated.  Current Outpatient Medications on File Prior to Visit  Medication Sig Dispense Refill  . chlorthalidone (HYGROTON) 25 MG tablet Take 1 tablet (25 mg total) by mouth daily. 30 tablet 0  . cholecalciferol (VITAMIN D) 1000 units tablet Take 1,000 Units by mouth daily.    . COD LIVER OIL PO Take 1 capsule by mouth daily.    Marland Kitchen  Cyanocobalamin (VITAMIN B-12 PO) Take 1 tablet by mouth daily.    . cyclobenzaprine (FLEXERIL) 5 MG tablet Take 1 tablet (5 mg total) by mouth at bedtime. 7 tablet 0  . escitalopram (LEXAPRO) 10 MG tablet Take 1 tablet (10 mg total) by mouth every other day. 45 tablet 3  . GARLIC PO Take 1 capsule by mouth daily.    Marland Kitchen lisinopril (PRINIVIL,ZESTRIL) 10 MG tablet TAKE 1 TABLET BY MOUTH ONCE DAILY 90 tablet 0   No current facility-administered medications on file prior to visit.     Review of Systems:  As per HPI- otherwise negative.   Physical Examination: There were no vitals filed for this visit. There were no vitals filed  for this visit. There is no height or weight on file to calculate BMI. Ideal Body Weight:    GEN: WDWN, NAD, Non-toxic, A & O x 3 HEENT: Atraumatic, Normocephalic. Neck supple. No masses, No LAD. Ears and Nose: No external deformity. CV: RRR, No M/G/R. No JVD. No thrill. No extra heart sounds. PULM: CTA B, no wheezes, crackles, rhonchi. No retractions. No resp. distress. No accessory muscle use. ABD: S, NT, ND, +BS. No rebound. No HSM. EXTR: No c/c/e NEURO Normal gait.  PSYCH: Normally interactive. Conversant. Not depressed or anxious appearing.  Calm demeanor.    Assessment and Plan: ***  Signed Lamar Blinks, MD

## 2018-06-30 ENCOUNTER — Ambulatory Visit: Payer: Self-pay | Admitting: Family Medicine

## 2018-07-01 NOTE — Progress Notes (Signed)
[place] at [facility] [address] [place], [place] [address] [phone]  Date:  07/02/2018   Name:  Sherry Butler   DOB:  12-Jun-1950   MRN:  [id number]  PCP:  Darreld Mclean, MD    Chief Complaint: Hypertension (follow up, 06/27/18 visit) and Side effects (lexapro side effect?, pain in shoulder, headache, )   History of Present Illness:  Sherry Butler is a 68 y.o. very pleasant female patient who presents with the following:  Following up today on elevated blood pressure She was seen by Percell Miller on February 7 with concern of elevated blood pressure She been seen at the surgical center the previous day for skin procedure, and her blood pressure was 220/114.  However BP improved when she checked it at [facility] later that day  Percell Miller had her continue lisinopril, and added chlorthalidone  She had a skin graft for a skin cancer on her nose last week; the area where they took the graft from behind her ear is the most tender, her nose is healing well  She was given chlorthalidone but never took it as her BP seem to come back to normal She has been checking her BP at home and it has looked fine-she brings a list of readings as follows  2/8 133/74, 136/79, 123/76, 117/79 2/9 127/81, 100/72, 141/86 2/10 138/86, 133/68,  122/73 2/11 123/72, 123/77, 135/ 81  She has been on lexapro for about a decade.  She only takes it every few days but has started to feel like it gives her SE and is not helping her- she feels like it makes her feel irritable or aggressive  She is also more anxious recently; maybe for 3 weeks She is not sure if this is due to her recent surgery or other factors She cut back her time at work, she is now part-time.  This is supposed to be a good thing but she feels like she has too much free time on her hands.  Her husband has lots of hobbies, and she wishes that she had some personal interest as well.  She  notes that her sex drive and mood are not as good as usual No SI at all   She also notes that she has had pain at her medial right shoulder blade for several years.  It is tender if she does not upright row type maneuver.  Otherwise it does not really bother her  Patient Active Problem List   Diagnosis Date Noted  . Left wrist pain 03/28/2017  . HSV-2 (herpes simplex virus 2) infection 10/24/2016  . Bilateral high frequency sensorineural hearing loss 09/04/2016  . OSA on CPAP 08/23/2016  . Essential hypertension 08/23/2016  . Mild depression ([place]) 08/23/2016  . Vertigo 08/23/2016  . Vitamin D deficiency 02/11/2015  . Anxiety 02/11/2015  . Mixed hyperlipidemia 02/11/2015  . Cataract extraction status 11/17/2014  . Osteopenia 11/09/2014  . Calculus of kidney 11/09/2014  . Other seborrheic keratosis 09/30/2014  . Neovascularization, retina 11/05/2013  . Retinal hemorrhage 11/05/2013  . Retinal macular atrophy 11/05/2013  . Progressive high myopia 11/05/2013  . Nonexudative age-related macular degeneration 11/05/2013  . Low back pain 07/20/2013  . Fatty liver 09/04/2012  . GERD (gastroesophageal reflux disease) 03/06/2012    Past Medical History:  Diagnosis Date  . Brain aneurysm   . Depression   . High cholesterol   . Hypertension   . Kidney calculi   .  Kidney stone   . Vertigo     Past Surgical History:  Procedure Laterality Date  . ABDOMINAL HYSTERECTOMY    . EYE SURGERY    . IR 3D INDEPENDENT WKST  11/22/2017  . IR ANGIO INTRA EXTRACRAN SEL COM CAROTID INNOMINATE UNI R MOD SED  11/22/2017  . IR ANGIO INTRA EXTRACRAN SEL INTERNAL CAROTID BILAT MOD SED  11/22/2017  . IR ANGIO VERTEBRAL SEL VERTEBRAL BILAT MOD SED  11/22/2017  . TUBAL LIGATION      Social History   Tobacco Use  . Smoking status: Former Research scientist (life sciences)  . Smokeless tobacco: Never Used  Substance Use Topics  . Alcohol use: No  . Drug use: Never    No family history on file.  Allergies  Allergen Reactions   . Propoxyphene Nausea And Vomiting and Other (See Comments)    Propoxyphene and Acetaminophen  . Demerol  [Meperidine Hcl] Nausea And Vomiting  . Valium [Diazepam] Other (See Comments)    Unknown    Medication list has been reviewed and updated.  Current Outpatient Medications on File Prior to Visit  Medication Sig Dispense Refill  . chlorthalidone (HYGROTON) 25 MG tablet Take 1 tablet (25 mg total) by mouth daily. 30 tablet 0  . cholecalciferol (VITAMIN D) 1000 units tablet Take 1,000 Units by mouth daily.    . COD LIVER OIL PO Take 1 capsule by mouth daily.    . Cyanocobalamin (VITAMIN B-12 PO) Take 1 tablet by mouth daily.    . cyclobenzaprine (FLEXERIL) 5 MG tablet Take 1 tablet (5 mg total) by mouth at bedtime. 7 tablet 0  . escitalopram (LEXAPRO) 10 MG tablet Take 1 tablet (10 mg total) by mouth every other day. 45 tablet 3  . GARLIC PO Take 1 capsule by mouth daily.    Marland Kitchen lisinopril (PRINIVIL,ZESTRIL) 10 MG tablet TAKE 1 TABLET BY MOUTH ONCE DAILY 90 tablet 0   No current facility-administered medications on file prior to visit.     Review of Systems:  As per HPI- otherwise negative. No fever or chills, no chest pain or shortness of breath  Physical Examination: Vitals:   07/02/18 1104  BP: (!) 144/80  Pulse: 67  Resp: 16  SpO2: 98%   Vitals:   07/02/18 1104  Weight: 179 lb (81.2 kg)  Height: 5\' 10"  (1.778 m)   Body mass index is 25.68 kg/m. Ideal Body Weight: Weight in (lb) to have BMI = 25: 173.9  GEN: WDWN, NAD, Non-toxic, A & O x 3 looks well, normal weight HEENT: Atraumatic, Normocephalic. Neck supple. No masses, No LAD.  Bilateral TM wnl, oropharynx normal.  PEERL,EOMI.   Ears and Nose: No external deformity. CV: RRR, No M/G/R. No JVD. No thrill. No extra heart sounds. PULM: CTA B, no wheezes, crackles, rhonchi. No retractions. No resp. distress. No accessory muscle use. EXTR: No c/c/e NEURO Normal gait.  PSYCH: Normally interactive. Conversant. Not  depressed or anxious appearing.  Calm demeanor.  She is tender in the musculature just medial to the right scapula.  No pain with shoulder range of motion.  No change in the skin  Assessment and Plan: Anxiety - Plan: TSH, venlafaxine XR (EFFEXOR-XR) 75 MG 24 hr capsule  Essential hypertension  Chronic right shoulder pain - Plan: DG Ribs Unilateral W/Chest Right, DG Scapula Right  Here today with a couple of concerns.  Her blood pressure was quite high at the surgical center, but it seems to have normalized.  We will have her  continue her current lisinopril dose She is monitoring her blood pressure at home and will let me know if it starts to go up.  She is not currently taking chlorthalidone Discussed her anxiety and mild depression.  She would like to stop Lexapro and try something else.  We will have her start on venlafaxine 75 mg, she will let me know how this works for her, she will alert me if she is not doing okay.  We also discussed how she might develop some hobbies or personal interests. We will check her thyroid to make sure this is not contributing to anxiety We will obtain x-rays of her right scapula area, due to long-term pain.  Assuming these are okay I would suggest heat or massage  Meds ordered this encounter  Medications  . venlafaxine XR (EFFEXOR-XR) 75 MG 24 hr capsule    Sig: Take 1 capsule (75 mg total) by mouth daily with breakfast.    Dispense:  30 capsule    Refill:  6    Signed Lamar Blinks, MD  Received her labs and x-rays Results for orders placed or performed in visit on 07/02/18  TSH  Result Value Ref Range   TSH 2.87 0.35 - 4.50 uIU/mL   Dg Ribs Unilateral W/chest Right  Result Date: 07/02/2018 CLINICAL DATA:  Chronic progressive right rib pain and right scapular pain for 2 years. EXAM: RIGHT RIBS AND CHEST - 3+ VIEW COMPARISON:  CT scan of the chest dated 01/29/2018 and chest x-ray dated 07/21/2017 FINDINGS: No fracture or other bone lesions are  seen involving the ribs. There is no evidence of pneumothorax or pleural effusion. Both lungs are clear. Heart size and mediastinal contours are within normal limits. Aortic atherosclerosis. IMPRESSION: Normal right ribs. Aortic Atherosclerosis (ICD10-I70.0). Electronically Signed   By: Lorriane Shire M.D.   On: 07/02/2018 17:06   Dg Scapula Right  Result Date: 07/02/2018 CLINICAL DATA:  Progressive right scapula pain for 2 years. EXAM: RIGHT SCAPULA - 2+ VIEWS COMPARISON:  Chest CT dated 01/29/2018 FINDINGS: There is no evidence of fracture or other focal bone lesions. Soft tissues are unremarkable. IMPRESSION: Negative. Electronically Signed   By: Lorriane Shire M.D.   On: 07/02/2018 17:07

## 2018-07-02 ENCOUNTER — Encounter: Payer: Self-pay | Admitting: Family Medicine

## 2018-07-02 ENCOUNTER — Ambulatory Visit (INDEPENDENT_AMBULATORY_CARE_PROVIDER_SITE_OTHER): Payer: PPO | Admitting: Family Medicine

## 2018-07-02 ENCOUNTER — Ambulatory Visit (HOSPITAL_BASED_OUTPATIENT_CLINIC_OR_DEPARTMENT_OTHER)
Admission: RE | Admit: 2018-07-02 | Discharge: 2018-07-02 | Disposition: A | Discharge status: Home / Self Care | Payer: PPO | Source: Ambulatory Visit | Attending: Family Medicine | Admitting: Family Medicine

## 2018-07-02 VITALS — BP 144/80 | HR 67 | Resp 16 | Ht 70.0 in | Wt 179.0 lb

## 2018-07-02 DIAGNOSIS — I10 Essential (primary) hypertension: Secondary | ICD-10-CM

## 2018-07-02 DIAGNOSIS — G8929 Other chronic pain: Secondary | ICD-10-CM | POA: Diagnosis not present

## 2018-07-02 DIAGNOSIS — M25511 Pain in right shoulder: Secondary | ICD-10-CM | POA: Insufficient documentation

## 2018-07-02 DIAGNOSIS — R0781 Pleurodynia: Secondary | ICD-10-CM | POA: Diagnosis not present

## 2018-07-02 DIAGNOSIS — F419 Anxiety disorder, unspecified: Secondary | ICD-10-CM

## 2018-07-02 LAB — TSH: TSH: 2.87 u[IU]/mL (ref 0.35–4.50)

## 2018-07-02 MED ORDER — VENLAFAXINE HCL ER 75 MG PO CP24: 75.0000 mg | ORAL_CAPSULE | Freq: Every day | ORAL | 6 refills | Status: DC

## 2018-07-02 NOTE — Patient Instructions (Signed)
It was good to see you today I agree that your blood pressure looks all right, continue to check it a couple times a week.  Continue taking lisinopril but no need for the other med for now   For anxiety, let us have you stop Lexapro and try Effexor instead.  Take this once daily, please let me know how it is working for you.  Will probably take 2 to 4 weeks to see full effect, will let me know if you are having any worsening in the meantime I agree that finding some other hobbies or interests may also be helpful for you, you have a lot to contribute!   Please go to the lab to have your thyroid checked Then please go to the ground floor and have x-rays taken of your right shoulder blade and ribs I will be in touch of these reports, assuming all looks okay I would suggest massage therapy, and possibly heat as well They may keep patches that you can put on your skin and then go about your normal activities

## 2018-07-03 ENCOUNTER — Telehealth: Payer: Self-pay | Admitting: Family Medicine

## 2018-07-03 NOTE — Telephone Encounter (Signed)
Copied from [place] [id number]. Topic: General - Other >> Jul 03, 2018 11:51 AM Keene Breath wrote: Reason for CRM: Patient called to request that the doctor or nurse call her because she has some questions about the medicaine, venlafaxine XR (EFFEXOR-XR) 75 MG 24 hr capsule, that the doctor prescribed.  Please advise and call patient back at [phone]

## 2018-07-04 NOTE — Telephone Encounter (Signed)
Pt calling back to check status.

## 2018-07-04 NOTE — Telephone Encounter (Signed)
I called her back- she wishes to try increasing her exercise prior to starting on effexor. I advised that this is ok, let me know if she needs anything  JC

## 2018-07-04 NOTE — Telephone Encounter (Signed)
Routed to shannon our office administrator in error due to B in last name so I am just now seeing this message.  Please advise dr.copland.

## 2018-08-05 ENCOUNTER — Other Ambulatory Visit: Payer: Self-pay | Admitting: Family Medicine

## 2018-08-11 NOTE — Progress Notes (Deleted)
I connected with Sherry Butler on 08/11/18 at  8:00 AM EDT by a video enabled telemedicine application and verified that I am speaking with the correct person using two identifiers.   Subjective:   Sherry Butler is a 68 y.o. female who presents for an Initial Medicare Annual Wellness Visit.  The Patient was informed that the wellness visit is to identify future health risk and educate and initiate measures that can reduce risk for increased disease through the lifespan.   Describes health as fair, good or great?   Review of Systems   No ROS.  Medicare Wellness Visit. Additional risk factors are reflected in the social history.   Sleep patterns:  Home Safety/Smoke Alarms: Feels safe in home. Smoke alarms in place.    Female:   Pap-  10/24/16     Mammo-       Dexa scan-    11/12/16    CCS-  Cologurard 09/30/17-      Objective:    There were no vitals filed for this visit. There is no height or weight on file to calculate BMI.  Advanced Directives 01/17/2018 11/22/2017 10/18/2017 10/09/2017 10/08/2017 06/22/2017 08/13/2016  Does Patient Have a Medical Advance Directive? No No No No No No No  Would patient like information on creating a medical advance directive? - No - Patient declined No - Patient declined - - - -    Current Medications (verified) Outpatient Encounter Medications as of 08/12/2018  Medication Sig  . chlorthalidone (HYGROTON) 25 MG tablet Take 1 tablet (25 mg total) by mouth daily.  . cholecalciferol (VITAMIN D) 1000 units tablet Take 1,000 Units by mouth daily.  . COD LIVER OIL PO Take 1 capsule by mouth daily.  . Cyanocobalamin (VITAMIN B-12 PO) Take 1 tablet by mouth daily.  . cyclobenzaprine (FLEXERIL) 5 MG tablet Take 1 tablet (5 mg total) by mouth at bedtime.  Marland Kitchen GARLIC PO Take 1 capsule by mouth daily.  Marland Kitchen lisinopril (PRINIVIL,ZESTRIL) 10 MG tablet Take 1 tablet by mouth once daily  . venlafaxine XR (EFFEXOR-XR) 75 MG 24 hr capsule Take 1 capsule (75 mg total) by  mouth daily with breakfast.   No facility-administered encounter medications on file as of 08/12/2018.     Allergies (verified) Propoxyphene; Demerol  [meperidine hcl]; and Valium [diazepam]   History: Past Medical History:  Diagnosis Date  . Brain aneurysm   . Depression   . High cholesterol   . Hypertension   . Kidney calculi   . Kidney stone   . Vertigo    Past Surgical History:  Procedure Laterality Date  . ABDOMINAL HYSTERECTOMY    . EYE SURGERY    . IR 3D INDEPENDENT WKST  11/22/2017  . IR ANGIO INTRA EXTRACRAN SEL COM CAROTID INNOMINATE UNI R MOD SED  11/22/2017  . IR ANGIO INTRA EXTRACRAN SEL INTERNAL CAROTID BILAT MOD SED  11/22/2017  . IR ANGIO VERTEBRAL SEL VERTEBRAL BILAT MOD SED  11/22/2017  . TUBAL LIGATION     No family history on file. Social History   Socioeconomic History  . Marital status: Married    Spouse name: Not on file  . Number of children: Not on file  . Years of education: Not on file  . Highest education level: Not on file  Occupational History  . Not on file  Social Needs  . Financial resource strain: Not on file  . Food insecurity:    Worry: Not on file    Inability:  Not on file  . Transportation needs:    Medical: Not on file    Non-medical: Not on file  Tobacco Use  . Smoking status: Former Research scientist (life sciences)  . Smokeless tobacco: Never Used  Substance and Sexual Activity  . Alcohol use: No  . Drug use: Never  . Sexual activity: Not on file  Lifestyle  . Physical activity:    Days per week: Not on file    Minutes per session: Not on file  . Stress: Not on file  Relationships  . Social connections:    Talks on phone: Not on file    Gets together: Not on file    Attends religious service: Not on file    Active member of club or organization: Not on file    Attends meetings of clubs or organizations: Not on file    Relationship status: Not on file  Other Topics Concern  . Not on file  Social History Narrative  . Not on file    Tobacco  Counseling Counseling given: Not Answered   Clinical Intake:                        Activities of Daily Living No flowsheet data found.   Immunizations and Health Maintenance Immunization History  Administered Date(s) Administered  . Influenza,inj,Quad PF,6+ Mos 05/19/2018   Health Maintenance Due  Topic Date Due  . TETANUS/TDAP  08/18/1969  . PNA vac Low Risk Adult (1 of 2 - PCV13) 08/19/2015  . MAMMOGRAM  08/09/2018    Patient Care Team: Copland, Gay Filler, MD as PCP - General (Family Medicine)  Indicate any recent Medical Services you may have received from other than [facility] providers in the past year (date may be approximate).     Assessment:   This is a routine wellness examination for Sherry Butler. Physical assessment deferred to PCP.  Hearing/Vision screen Unable (virtual visit)  Dietary issues and exercise activities discussed:   Diet (meal preparation, eat out, water intake, caffeinated beverages, dairy products, fruits and vegetables): {Desc; diets:16563} Breakfast: Lunch:  Dinner:      Goals   None    Depression Screen PHQ 2/9 Scores 01/25/2017  PHQ - 2 Score 1    Fall Risk Fall Risk  01/25/2017  Falls in the past year? No    Cognitive Function:        Screening Tests Health Maintenance  Topic Date Due  . TETANUS/TDAP  08/18/1969  . PNA vac Low Risk Adult (1 of 2 - PCV13) 08/19/2015  . MAMMOGRAM  08/09/2018  . Fecal DNA (Cologuard)  09/30/2020  . INFLUENZA VACCINE  Completed  . DEXA SCAN  Completed  . Hepatitis C Screening  Completed      Plan:   ***  I have personally reviewed and noted the following in the patient's chart:   . Medical and social history . Use of alcohol, tobacco or illicit drugs  . Current medications and supplements . Functional ability and status . Nutritional status . Physical activity . Advanced directives . List of other physicians . Hospitalizations, surgeries, and ER visits in previous 12  months . Vitals . Screenings to include cognitive, depression, and falls . Referrals and appointments  In addition, I have reviewed and discussed with patient certain preventive protocols, quality metrics, and best practice recommendations. A written personalized care plan for preventive services as well as general preventive health recommendations were provided to patient.     Shela Nevin,  RN   08/11/2018

## 2018-08-12 ENCOUNTER — Other Ambulatory Visit: Payer: Self-pay

## 2018-08-12 ENCOUNTER — Ambulatory Visit: Payer: PPO | Admitting: *Deleted

## 2018-08-21 ENCOUNTER — Encounter: Payer: Self-pay | Admitting: Family Medicine

## 2018-08-21 ENCOUNTER — Other Ambulatory Visit: Payer: Self-pay

## 2018-08-21 ENCOUNTER — Ambulatory Visit: Payer: Self-pay | Admitting: *Deleted

## 2018-08-21 ENCOUNTER — Ambulatory Visit (INDEPENDENT_AMBULATORY_CARE_PROVIDER_SITE_OTHER): Payer: PPO | Admitting: Family Medicine

## 2018-08-21 VITALS — BP 176/86 | HR 68

## 2018-08-21 DIAGNOSIS — G4733 Obstructive sleep apnea (adult) (pediatric): Secondary | ICD-10-CM | POA: Diagnosis not present

## 2018-08-21 DIAGNOSIS — I10 Essential (primary) hypertension: Secondary | ICD-10-CM

## 2018-08-21 DIAGNOSIS — F419 Anxiety disorder, unspecified: Secondary | ICD-10-CM | POA: Diagnosis not present

## 2018-08-21 MED ORDER — VENLAFAXINE HCL ER 75 MG PO CP24: 75.0000 mg | ORAL_CAPSULE | Freq: Every day | ORAL | 1 refills | Status: DC

## 2018-08-21 MED ORDER — ESCITALOPRAM OXALATE 10 MG PO TABS: 10.0000 mg | ORAL_TABLET | ORAL | 1 refills | Status: DC

## 2018-08-21 MED ORDER — LISINOPRIL 20 MG PO TABS: 20.0000 mg | ORAL_TABLET | Freq: Every day | ORAL | 3 refills | Status: AC

## 2018-08-21 NOTE — Progress Notes (Addendum)
[facility] [address] [place], [place] [address] [phone]  Date:  08/21/2018   Name:  Sherry Butler   DOB:  1950/08/24   MRN:  [id number]  PCP:  Darreld Mclean, MD    Chief Complaint: No chief complaint on file.   History of Present Illness:  Sherry Butler is a 68 y.o. very pleasant female patient who presents with the following:  webex visit today due to COVID 19 outbreak Pt identity confirmed with her name and DOB  I last saw this patient in February for concern of anxiety and elevated blood pressure noted at surgical center when she went in for a skin procedure.  Her blood pressure looked acceptable however when we visited in clinic.  I started her on Effexor for anxiety and mild depression.  She had been on lexapro at that time and did not feel like it was really helping, wanted to try something different However it turns out that she never started on effexor She has history of hypertension, hyperlipidemia, macular degeneration, anxiety  She notes not feeling well for the last 2 days She feels "like a fish out of water," like it is hard for her to get a deep breath, but she denies any SOB or CP No fever No cough Her exercise capacity is unchanged She notes some tenderness if she presses on the right side of her chest only She really notes her sx more when she is at rest - when she is up and active she feels ok She feels like anxiety is the problem  She is still going to work, about 5 hours a day, in retail, 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  When she got home her home BP was 180/98 and her pulse was in the 80s.  Since then her BP has been better. However I had her  check it today and it was still elevated as below   BP Readings from Last 3 Encounters:  08/21/18 (!) 176/86  07/02/18 (!) 144/80  06/27/18 (!) 163/87    Patient Active Problem List   Diagnosis Date Noted  . Left wrist pain 03/28/2017  . HSV-2 (herpes simplex virus 2) infection 10/24/2016  . Bilateral high frequency sensorineural hearing loss 09/04/2016  . OSA on CPAP 08/23/2016  . Essential hypertension 08/23/2016  . Mild depression ([place]) 08/23/2016  . Vertigo 08/23/2016  . Vitamin D deficiency 02/11/2015  . Anxiety 02/11/2015  . Mixed hyperlipidemia 02/11/2015  . Cataract extraction status 11/17/2014  . Osteopenia 11/09/2014  . Calculus of kidney 11/09/2014  . Other seborrheic keratosis 09/30/2014  . Neovascularization, retina 11/05/2013  . Retinal hemorrhage 11/05/2013  . Retinal macular atrophy 11/05/2013  . Progressive high myopia 11/05/2013  . Nonexudative age-related macular degeneration 11/05/2013  . Low back pain 07/20/2013  . Fatty liver 09/04/2012  . GERD (gastroesophageal reflux disease) 03/06/2012    Past Medical History:  Diagnosis Date  . Brain aneurysm   . Depression   . High cholesterol   . Hypertension   . Kidney calculi   . Kidney stone   . Vertigo     Past Surgical History:  Procedure Laterality Date  . ABDOMINAL  HYSTERECTOMY    . EYE SURGERY    . IR 3D INDEPENDENT WKST  11/22/2017  . IR ANGIO INTRA EXTRACRAN SEL COM CAROTID INNOMINATE UNI R MOD SED  11/22/2017  . IR ANGIO INTRA EXTRACRAN SEL INTERNAL CAROTID BILAT MOD SED  11/22/2017  . IR ANGIO VERTEBRAL SEL VERTEBRAL BILAT MOD SED  11/22/2017  . TUBAL LIGATION      Social History   Tobacco Use  . Smoking status: Former Research scientist (life sciences)  . Smokeless tobacco: Never Used  Substance Use Topics  . Alcohol use: No  . Drug use: Never    History reviewed. No pertinent family history.  Allergies  Allergen Reactions  . Propoxyphene Nausea And Vomiting and Other (See Comments)    Propoxyphene and  Acetaminophen  . Demerol  [Meperidine Hcl] Nausea And Vomiting  . Valium [Diazepam] Other (See Comments)    Unknown    Medication list has been reviewed and updated.  Current Outpatient Medications on File Prior to Visit  Medication Sig Dispense Refill  . chlorthalidone (HYGROTON) 25 MG tablet Take 1 tablet (25 mg total) by mouth daily. 30 tablet 0  . cholecalciferol (VITAMIN D) 1000 units tablet Take 1,000 Units by mouth daily.    . COD LIVER OIL PO Take 1 capsule by mouth daily.    . Cyanocobalamin (VITAMIN B-12 PO) Take 1 tablet by mouth daily.    . cyclobenzaprine (FLEXERIL) 5 MG tablet Take 1 tablet (5 mg total) by mouth at bedtime. 7 tablet 0  . GARLIC PO Take 1 capsule by mouth daily.     No current facility-administered medications on file prior to visit.     Review of Systems:  As per HPI- otherwise negative.   Physical Examination: Vitals:   08/21/18 1537 08/21/18 1543  BP: (!) 173/73 (!) 176/86  Pulse: 71 68   There were no vitals filed for this visit. There is no height or weight on file to calculate BMI. Ideal Body Weight:    Observed pt on webex  She looks well, her normal self.  No SOB, cough, wheeze or distress is apparent   Assessment and Plan: Essential hypertension - Plan: lisinopril (PRINIVIL,ZESTRIL) 20 MG tablet  Anxiety - Plan: escitalopram (LEXAPRO) 10 MG tablet, DISCONTINUED: venlafaxine XR (EFFEXOR-XR) 75 MG 24 hr capsule  Monteen is seen via virtual visit today with concern of elevated blood pressure and anxiety. I had started her on Effexor earlier this year as she felt Lexapro was not really working for her.  However she actually never started the Effexor, and is currently not taking anything.  She would rather go back on Lexapro since she is familiar with this medication.  This is fine with me, I will send prescription to her drugstore now She also has noted high blood pressure over the last week or so.  This also was a concern earlier this  year, when her blood pressure was high at the surgical center.  She is on chlorthalidone and also lisinopril.  I will increase her lisinopril to 20 mg daily Recent metabolic profile in February looked okay  Discussed limitations of virtual visit in detail with patient today.  We discussed having her come in for further evaluation and EKG.  At this time she does not feel that she wants to come in due to [place] 19 outbreak, but will watch herself carefully for any worsening or change of her symptoms.  She notes a vague feeling of her breathing being different, but feels that this  is due to anxiety.  I did ask her to take an aspirin daily, and I will call her tomorrow to check on how she is doing As always I advised her that if any distress, chest pain or significant shortness of breath she should go to the emergency room  She is working in the morning, so I will call her in the afternoon tomorrow  Signed Lamar Blinks, MD  Called her on 4/3 to check on her Her chest and breathing are feeling fine today She did not yet increase her lisinopril as she did not go out to the pharmacy  151/82 this am  She will go ahead and increase to 20 mg of lisinopril using what she has on hand Also I realized that we did not change the sig of her lexapro from every other day to every day; she plans to use it daily for now Asked her to keep me posted about any concerns

## 2018-08-21 NOTE — Telephone Encounter (Signed)
Pt called with complaints of chest tightness since 08/16/2018; she says that she is not sure if it is allergies or bronchitis; she says that it feels "raw"; the pt says that for the past few days she has acid reflux; the pt answered "no" to questions on [facility] Corona Virus Evaluation; however she works in Scientist, research (medical); nurse triage initiated and recommendations made per nurse triage protocol; the pt also says that she can not get her brain to turn off since she stopped taking her medication a month ago (pt not sure of the name); she is agreeable to a virtual visit; her email is [email or web address] and she can be contacted at [phone]; a message can be left on the voicemail; she normally sees Dr Janett Billow Copland, [place]; will route to office for final disposition.  Reason for Disposition . [1] MILD difficulty breathing (e.g., minimal/no SOB at rest, SOB with walking, pulse <100) AND [2] NEW-onset or WORSE than normal  Answer Assessment - Initial Assessment Questions 1. RESPIRATORY STATUS: "Describe your breathing?" (e.g., wheezing, shortness of breath, unable to speak, severe coughing)      "Feels like a fish out of water; "chest feels raw" 2. ONSET: "When did this breathing problem begin?"     08/18/2018 3. PATTERN "Does the difficult breathing come and go, or has it been constant since it started?"     constant 4. SEVERITY: "How bad is your breathing?" (e.g., mild, moderate, severe)    - MILD: No SOB at rest, mild SOB with walking, speaks normally in sentences, can lay down, no retractions, pulse < 100.    - MODERATE: SOB at rest, SOB with minimal exertion and prefers to sit, cannot lie down flat, speaks in phrases, mild retractions, audible wheezing, pulse 100-120.    - SEVERE: Very SOB at rest, speaks in single words, struggling to breathe, sitting hunched forward, retractions, pulse > 120     The pt feels it when she is sitting 5. RECURRENT SYMPTOM: "Have you had difficulty breathing before?" If so,  ask: "When was the last time?" and "What happened that time?"      When she had bronchitis about 3 years ago; when she had the flu a few months ago 6. CARDIAC HISTORY: "Do you have any history of heart disease?" (e.g., heart attack, angina, bypass surgery, angioplasty)      no 7. LUNG HISTORY: "Do you have any history of lung disease?"  (e.g., pulmonary embolus, asthma, emphysema)     Hx bronchitis 8. CAUSE: "What do you think is causing the breathing problem?"      oot sure maybe related to reflux 9. OTHER SYMPTOMS: "Do you have any other symptoms? (e.g., dizziness, runny nose, cough, chest pain, fever)     Shortness of breath, Upset stomach, GERD chest feels raw, runny nosed due to allergies when she goes outside   10. PREGNANCY: "Is there any chance you are pregnant?" "When was your last menstrual period?"       no 11. TRAVEL: "Have you traveled out of the country in the last month?" (e.g., travel history, exposures)       No, no  Protocols used: BREATHING DIFFICULTY-A-AH

## 2018-08-21 NOTE — Patient Instructions (Signed)
It was nice to see you today!  Please let us know if you are not doing ok

## 2018-08-21 NOTE — Telephone Encounter (Signed)
Scheduled pt for webex visit to see pcp today at 3:40.

## 2018-08-26 ENCOUNTER — Telehealth: Payer: Self-pay

## 2018-08-26 NOTE — Telephone Encounter (Signed)
Copied from [place] [id number]. Topic: General - Other >> Aug 26, 2018  9:18 AM Percell Belt A wrote: Reason for CRM: pt called in and stated that she was seen on 4/2 and she was told to call back in if was not feeling any better.  She feel like when she goes outside she is still heavy in her chest and feels like she is having issues with allergies    Best number [phone]

## 2018-08-26 NOTE — Telephone Encounter (Signed)
Called her back- we had spoken on 4/2 She feels like she is nervous- she just "broke down and took my nerve pill," which is her lexapro- encouraged her to take this daily for the time being while things are so stressful She notes that as long as she stays busy she is ok- when she stops moving and tries to relax she feels SOB She just found out that a co-worker was dx with COVID- this has her really nervous She also wonders if she has allergies  She is not having a fever or cough Her BP is better; she still has some high readings but overall improved. 135/85 today, 144/80 She will try some generic claritin or zyrtec  She will try this, but if her sx continue she will let me know and I will bring her in the office on Thursday

## 2018-08-26 NOTE — Telephone Encounter (Signed)
If patient is seen within 8 days of previous visit we are unable to charge again for second visit. Please advise.

## 2018-08-28 ENCOUNTER — Ambulatory Visit (INDEPENDENT_AMBULATORY_CARE_PROVIDER_SITE_OTHER): Payer: PPO | Admitting: Family Medicine

## 2018-08-28 ENCOUNTER — Encounter: Payer: Self-pay | Admitting: Family Medicine

## 2018-08-28 ENCOUNTER — Emergency Department (HOSPITAL_BASED_OUTPATIENT_CLINIC_OR_DEPARTMENT_OTHER)
Admission: EM | Admit: 2018-08-28 | Discharge: 2018-08-28 | Disposition: A | Discharge status: Home / Self Care | Payer: PPO | Attending: Emergency Medicine | Admitting: Emergency Medicine

## 2018-08-28 ENCOUNTER — Encounter (HOSPITAL_BASED_OUTPATIENT_CLINIC_OR_DEPARTMENT_OTHER): Payer: Self-pay | Admitting: Emergency Medicine

## 2018-08-28 ENCOUNTER — Other Ambulatory Visit: Payer: Self-pay

## 2018-08-28 DIAGNOSIS — R1013 Epigastric pain: Secondary | ICD-10-CM | POA: Diagnosis present

## 2018-08-28 DIAGNOSIS — E1169 Type 2 diabetes mellitus with other specified complication: Secondary | ICD-10-CM | POA: Diagnosis not present

## 2018-08-28 DIAGNOSIS — E785 Hyperlipidemia, unspecified: Secondary | ICD-10-CM | POA: Diagnosis not present

## 2018-08-28 DIAGNOSIS — Z79899 Other long term (current) drug therapy: Secondary | ICD-10-CM | POA: Insufficient documentation

## 2018-08-28 DIAGNOSIS — E1122 Type 2 diabetes mellitus with diabetic chronic kidney disease: Secondary | ICD-10-CM | POA: Diagnosis not present

## 2018-08-28 DIAGNOSIS — Z125 Encounter for screening for malignant neoplasm of prostate: Secondary | ICD-10-CM | POA: Diagnosis not present

## 2018-08-28 DIAGNOSIS — Z862 Personal history of diseases of the blood and blood-forming organs and certain disorders involving the immune mechanism: Secondary | ICD-10-CM | POA: Diagnosis not present

## 2018-08-28 DIAGNOSIS — I10 Essential (primary) hypertension: Secondary | ICD-10-CM | POA: Diagnosis not present

## 2018-08-28 DIAGNOSIS — Z87891 Personal history of nicotine dependence: Secondary | ICD-10-CM | POA: Insufficient documentation

## 2018-08-28 DIAGNOSIS — Z0000 Encounter for general adult medical examination without abnormal findings: Secondary | ICD-10-CM | POA: Diagnosis not present

## 2018-08-28 DIAGNOSIS — K219 Gastro-esophageal reflux disease without esophagitis: Secondary | ICD-10-CM | POA: Insufficient documentation

## 2018-08-28 DIAGNOSIS — F411 Generalized anxiety disorder: Secondary | ICD-10-CM | POA: Diagnosis not present

## 2018-08-28 DIAGNOSIS — E7800 Pure hypercholesterolemia, unspecified: Secondary | ICD-10-CM | POA: Diagnosis not present

## 2018-08-28 DIAGNOSIS — Z124 Encounter for screening for malignant neoplasm of cervix: Secondary | ICD-10-CM | POA: Diagnosis not present

## 2018-08-28 DIAGNOSIS — E118 Type 2 diabetes mellitus with unspecified complications: Secondary | ICD-10-CM | POA: Diagnosis not present

## 2018-08-28 DIAGNOSIS — N183 Chronic kidney disease, stage 3 unspecified: Secondary | ICD-10-CM | POA: Diagnosis not present

## 2018-08-28 DIAGNOSIS — F419 Anxiety disorder, unspecified: Secondary | ICD-10-CM | POA: Insufficient documentation

## 2018-08-28 DIAGNOSIS — Z1211 Encounter for screening for malignant neoplasm of colon: Secondary | ICD-10-CM | POA: Diagnosis not present

## 2018-08-28 DIAGNOSIS — Z1231 Encounter for screening mammogram for malignant neoplasm of breast: Secondary | ICD-10-CM | POA: Diagnosis not present

## 2018-08-28 DIAGNOSIS — F410 Panic disorder [episodic paroxysmal anxiety] without agoraphobia: Secondary | ICD-10-CM | POA: Diagnosis not present

## 2018-08-28 DIAGNOSIS — E1159 Type 2 diabetes mellitus with other circulatory complications: Secondary | ICD-10-CM | POA: Diagnosis not present

## 2018-08-28 MED ORDER — HYDROXYZINE HCL 25 MG PO TABS: 12.5000 mg | ORAL_TABLET | Freq: Three times a day (TID) | ORAL | 0 refills | Status: DC | PRN

## 2018-08-28 MED ORDER — SUCRALFATE 1 GM/10ML PO SUSP
1.0000 g | Freq: Once | ORAL | Status: AC
  Administered 2018-08-28: 05:00:00 1 g via ORAL
  Filled 2018-08-28: qty 10

## 2018-08-28 MED ORDER — SUCRALFATE 1 G PO TABS: 1.0000 g | ORAL_TABLET | Freq: Three times a day (TID) | ORAL | 0 refills | Status: DC

## 2018-08-28 MED ORDER — OMEPRAZOLE 20 MG PO CPDR: 20.0000 mg | DELAYED_RELEASE_CAPSULE | Freq: Every day | ORAL | 0 refills | Status: DC

## 2018-08-28 MED ORDER — PANTOPRAZOLE SODIUM 40 MG PO TBEC
80.0000 mg | DELAYED_RELEASE_TABLET | Freq: Once | ORAL | Status: AC
  Administered 2018-08-28: 80 mg via ORAL
  Filled 2018-08-28: qty 2

## 2018-08-28 NOTE — Patient Instructions (Addendum)
It was good to see you today!  I am sorry that you are having a hard time Please see your work note enclosed If your BP is consistently running higher than 140/90 you can increase your lisinopril to 30 mg (1.5 tablets)  You are using omeprazole (prilose) 20 mg for your stomach - you can take this for 12 week, or longer if needed  We will also add carafate 4x a day for about 10 days for your stomach as well  For anxiety, we will use hydroxyzine as needed- this medication can make you feel a bit drowsy so please use caution  Please do keep me posted as to how you are feeling

## 2018-08-28 NOTE — Patient Outreach (Signed)
[facility]) Care Management  08/28/2018  Nishka Herber June 10, 1950 [id number]  Nurse Call Line Referral Date: 08/28/18 Reason for Referral:  Acid reflux/ anxiety Nurse call line recommendation: follow up with primary MD within 4 hour  Telephone call to patient regarding nurse advise line referral. HIPAA verified with patient. Explained reason for call. Patient states she went to the hospital as advised.  She states she was given medication in the hospital which has provided some relief. She states she was advised to see a gastroenterologist. Patients states she spoke with her doctor's office after being seen at the hospital and she has a facetime appointment with her doctor today at 3:00pm.  Patient states she has been drinking chamomile tea and ate oatmeal today.  Patient voiced concern regarding her potential exposure in the hospital due to the [place] 19 virus. RNCM provided reassurance to patient. Advised patient regarding the COVID 19 self management recommendations. Discussed COVID 19 symptoms with patient. Advised patient to contact her doctor immediately for symptoms. Patient verbalized understanding.  RNCM advised patient to notify MD of any changes in condition prior to scheduled appointment. RNCM verified patient aware of 911 services for urgent/ emergent needs.  PLAN: RNCM will close patient due to patient being assessed and having no further needs.   Quinn Plowman RN,BSN,CCM [facility] Telephonic  [phone]

## 2018-08-28 NOTE — ED Triage Notes (Signed)
Pt has ongoing anxiety issues that are worse lately - pt attributes this to panic attack. Pt reports "butterflies in my stomach" for  One week. Reports tonight after eating green beans, potatoes, squash and cheetos and then started burping. Reported the burping increased her anxiety. Reports she tried drinking a glass of milk and tums x3 with no relief. Called her insurance company who was concerned she may have an ucler and needs to be seen.  Denies chest pain, shortness of breath, coughing, vomiting. Reports one episode of diarrhea. Pt advised of delays.

## 2018-08-28 NOTE — ED Provider Notes (Signed)
[name] Provider Note: Sherry Spurling, MD, FACEP  CSN: [id number] MRN: [id number] ARRIVAL: 08/28/18 at [place]: [name]  Gastroesophageal Reflux (Burping) and Anxiety   HISTORY OF PRESENT ILLNESS  08/28/18 4:19 AM  Sherry Butler is a 68 y.o. female with a history of GERD not currently on any medications.  She treats herself with diet.  She states she ate green beans, potatoes and squash for dinner yesterday evening.  Later while playing cards she ate Cheetos.  About 3:00 this morning she awakened with her typical acid reflux symptoms, specifically substernal burning up into her throat with an acid taste.  This was more severe than her usual episodes and she became very anxious.  She denies shortness of breath, coughing or vomiting.  She is having some epigastric burning.  She had one episode of diarrhea.   Past Medical History:  Diagnosis Date  . Brain aneurysm   . Depression   . High cholesterol   . Hypertension   . Kidney calculi   . Kidney stone   . Vertigo     Past Surgical History:  Procedure Laterality Date  . ABDOMINAL HYSTERECTOMY    . EYE SURGERY    . IR 3D INDEPENDENT WKST  11/22/2017  . IR ANGIO INTRA EXTRACRAN SEL COM CAROTID INNOMINATE UNI R MOD SED  11/22/2017  . IR ANGIO INTRA EXTRACRAN SEL INTERNAL CAROTID BILAT MOD SED  11/22/2017  . IR ANGIO VERTEBRAL SEL VERTEBRAL BILAT MOD SED  11/22/2017  . TUBAL LIGATION      History reviewed. No pertinent family history.  Social History   Tobacco Use  . Smoking status: Former Research scientist (life sciences)  . Smokeless tobacco: Never Used  Substance Use Topics  . Alcohol use: No  . Drug use: Never    Prior to Admission medications   Medication Sig Start Date End Date Taking? Authorizing Provider  chlorthalidone (HYGROTON) 25 MG tablet Take 1 tablet (25 mg total) by mouth daily. 06/27/18   Saguier, Percell Miller, PA-C  cholecalciferol (VITAMIN D) 1000 units tablet Take 1,000 Units by mouth daily.    [provider]  COD LIVER OIL PO Take 1 capsule by mouth daily.    [provider]  Cyanocobalamin (VITAMIN B-12 PO) Take 1 tablet by mouth daily.    [provider]  cyclobenzaprine (FLEXERIL) 5 MG tablet Take 1 tablet (5 mg total) by mouth at bedtime. 06/27/18   Saguier, Percell Miller, PA-C  escitalopram (LEXAPRO) 10 MG tablet Take 1 tablet (10 mg total) by mouth every other day. 08/21/18   Copland, Gay Filler, MD  GARLIC PO Take 1 capsule by mouth daily.    [provider]  lisinopril (PRINIVIL,ZESTRIL) 20 MG tablet Take 1 tablet (20 mg total) by mouth daily. 08/21/18   Copland, Gay Filler, MD    Allergies Propoxyphene; Demerol  [meperidine hcl]; and Valium [diazepam]   REVIEW OF SYSTEMS  Negative except as noted here or in the History of Present Illness.   PHYSICAL EXAMINATION  Initial Vital Signs Blood pressure (!) 192/92, pulse 66, temperature 98.2 F (36.8 C), temperature source Oral, resp. rate 16, height 5\' 10"  (1.778 m), weight 81.2 kg, SpO2 97 %.  Examination General: Well-developed, well-nourished female in no acute distress; appearance consistent with age of record HENT: normocephalic; atraumatic Eyes: Normal appearance Neck: supple Heart: regular rate and rhythm Lungs: clear to auscultation bilaterally Abdomen: soft; nondistended; gastric tenderness; bowel sounds present Extremities: No deformity; full range of motion  Neurologic: Awake, alert and oriented; motor function intact in all extremities and symmetric; no facial droop Skin: Warm and dry Psychiatric: Anxious   RESULTS  Summary of this visit's results, reviewed by myself:   EKG Interpretation  Date/Time:    Ventricular Rate:    PR Interval:    QRS Duration:   QT Interval:    QTC Calculation:   R Axis:     Text Interpretation:        Laboratory Studies: No results found for this or any previous visit (from the past 24 hour(s)). Imaging Studies: No results found.  ED COURSE and  MDM  Nursing notes and initial vitals signs, including pulse oximetry, reviewed.  Vitals:   08/28/18 0355  BP: (!) 192/92  Pulse: 66  Resp: 16  Temp: 98.2 F (36.8 C)  TempSrc: Oral  SpO2: 97%  Weight: 81.2 kg  Height: 5\' 10"  (1.778 m)   Patient on a PPI and refer back to her primary care physician.  PROCEDURES    ED DIAGNOSES     ICD-10-CM   1. Gastroesophageal reflux disease without esophagitis K21.9   2. Anxiety F41.9        Miloh Alcocer, Jenny Reichmann, MD 08/28/18 [phone]

## 2018-08-28 NOTE — Progress Notes (Signed)
[place] at [facility] [address] [place], [place] [address] [phone]  Date:  08/28/2018   Name:  Batool Majid   DOB:  1951-04-04   MRN:  [id number]  PCP:  Darreld Mclean, MD    Chief Complaint: No chief complaint on file.   History of Present Illness:  Ardyce Crow is a 68 y.o. very pleasant female patient who presents with the following:  I have talked to this pt several times recently due to SOB Most recently we spoke on the phone on 4/7.  At that time she was having some chest tightness and burning, she thought due to GERD and anxiety but was really not sure  She ended up in the ER late last night with concern of reflux symptoms, burning up into her throat She was started on a PPI, and asked to follow-up with me Her blood pressure was quite high in the ER However otherwise she appeared to be okay  She was started on prilosec 20 per the ER -her husband is going to pick this up for her today BP Readings from Last 3 Encounters:  08/28/18 (!) 192/92  08/21/18 (!) 176/86  07/02/18 (!) 144/80   She feels like lexapro actually makes her more anxious.  She will stop taking it   She tried valium in the past and she had suicidal ideation -we thought of using alprazolam for panic, but in light of her Valium intolerance decided to use Atarax instead She is taking 20 mg of lisinopril now Had her check her BP and pulse right now 167/100 Pulse 61 BP yesterday 138/86 or thereabouts, she checked it several times  Patient Active Problem List   Diagnosis Date Noted  . Left wrist pain 03/28/2017  . HSV-2 (herpes simplex virus 2) infection 10/24/2016  . Bilateral high frequency sensorineural hearing loss 09/04/2016  . OSA on CPAP 08/23/2016  . Essential hypertension 08/23/2016  . Mild depression ([place]) 08/23/2016  . Vertigo 08/23/2016  . Vitamin D deficiency 02/11/2015  . Anxiety 02/11/2015  . Mixed hyperlipidemia  02/11/2015  . Cataract extraction status 11/17/2014  . Osteopenia 11/09/2014  . Calculus of kidney 11/09/2014  . Other seborrheic keratosis 09/30/2014  . Neovascularization, retina 11/05/2013  . Retinal hemorrhage 11/05/2013  . Retinal macular atrophy 11/05/2013  . Progressive high myopia 11/05/2013  . Nonexudative age-related macular degeneration 11/05/2013  . Low back pain 07/20/2013  . Fatty liver 09/04/2012  . GERD (gastroesophageal reflux disease) 03/06/2012    Past Medical History:  Diagnosis Date  . Brain aneurysm   . Depression   . High cholesterol   . Hypertension   . Kidney calculi   . Kidney stone   . Vertigo     Past Surgical History:  Procedure Laterality Date  . ABDOMINAL HYSTERECTOMY    . EYE SURGERY    . IR 3D INDEPENDENT WKST  11/22/2017  . IR ANGIO INTRA EXTRACRAN SEL COM CAROTID INNOMINATE UNI R MOD SED  11/22/2017  . IR ANGIO INTRA EXTRACRAN SEL INTERNAL CAROTID BILAT MOD SED  11/22/2017  . IR ANGIO VERTEBRAL SEL VERTEBRAL BILAT MOD SED  11/22/2017  . TUBAL LIGATION      Social History   Tobacco Use  . Smoking status: Former Research scientist (life sciences)  . Smokeless tobacco: Never Used  Substance Use Topics  . Alcohol use: No  . Drug use: Never    No family history on file.  Allergies  Allergen Reactions  .  Propoxyphene Nausea And Vomiting and Other (See Comments)    Propoxyphene and Acetaminophen  . Demerol  [Meperidine Hcl] Nausea And Vomiting  . Valium [Diazepam] Other (See Comments)    Unknown    Medication list has been reviewed and updated.  Current Outpatient Medications on File Prior to Visit  Medication Sig Dispense Refill  . chlorthalidone (HYGROTON) 25 MG tablet Take 1 tablet (25 mg total) by mouth daily. 30 tablet 0  . cholecalciferol (VITAMIN D) 1000 units tablet Take 1,000 Units by mouth daily.    . COD LIVER OIL PO Take 1 capsule by mouth daily.    . Cyanocobalamin (VITAMIN B-12 PO) Take 1 tablet by mouth daily.    . cyclobenzaprine (FLEXERIL) 5  MG tablet Take 1 tablet (5 mg total) by mouth at bedtime. 7 tablet 0  . escitalopram (LEXAPRO) 10 MG tablet Take 1 tablet (10 mg total) by mouth every other day. 90 tablet 1  . GARLIC PO Take 1 capsule by mouth daily.    Marland Kitchen lisinopril (PRINIVIL,ZESTRIL) 20 MG tablet Take 1 tablet (20 mg total) by mouth daily. 90 tablet 3  . omeprazole (PRILOSEC) 20 MG capsule Take 1 capsule (20 mg total) by mouth daily. 30 capsule 0   No current facility-administered medications on file prior to visit.     Review of Systems:  No fever or chills  Physical Examination: There were no vitals filed for this visit. There were no vitals filed for this visit. There is no height or weight on file to calculate BMI. Ideal Body Weight:    Pt observed over web camera - she looks well, her normal self No tachypnea, cough, wheezing is noted  Assessment and Plan: Gastroesophageal reflux disease, esophagitis presence not specified - Plan: sucralfate (CARAFATE) 1 g tablet  Panic attack - Plan: hydrOXYzine (ATARAX/VISTARIL) 25 MG tablet  Following up today from hospital visit.  She was seen in the ER late last night/early this morning with symptomatic reflux.  She was started on PPI.  I will add Carafate for 10 days, she will let me know how this works for her She is also suffering from panic and anxiety.  We will have her stop Lexapro as she feels like this makes her worse.  Gave her hydroxyzine to use instead as needed She requests to stay out of work for 10 days, she is really getting anxious working at [employer] and being exposed to somebody people I think this is a reasonable request, and I gave her a note Note for pt to be out for 10 days from today ----------------------  It was good to see you today!  I am sorry that you are having a hard time Please see your work note enclosed If your BP is consistently running higher than 140/90 you can increase your lisinopril to 30 mg (1.5 tablets)  You are using omeprazole  (prilose) 20 mg for your stomach - you can take this for 12 week, or longer if needed  We will also add carafate 4x a day for about 10 days for your stomach as well  For anxiety, we will use hydroxyzine as needed- this medication can make you feel a bit drowsy so please use caution  Please do keep me posted as to how you are feeling  Signed Lamar Blinks, MD

## 2018-09-08 ENCOUNTER — Telehealth: Payer: Self-pay | Admitting: Family Medicine

## 2018-09-08 NOTE — Telephone Encounter (Signed)
Copied from [place] [id number]. Topic: Quick Communication - See Telephone Encounter >> Sep 08, 2018  8:20 AM Margot Ables wrote: CRM for notification. See Telephone encounter for: 09/08/18. Pt states that she is supposed to return to work this week. She said that her stomach is getting upset and she is having anxiety. She is only eating rice and oatmeal. She talked to her boss yesterday and for 3 hours after her stomach was very upset. Pt asking advice from Dr. Lorelei Pont.

## 2018-09-08 NOTE — Telephone Encounter (Signed)
Called her back-  She notes that she is getting gas, anxiety if she tries to eat more than just a little  Hydroxyzine does seem to help Her anxiety seems to get worse at night  She made a plan with her boss, and plans to work 3rd shift so she will not be around so many people She feels very torn between wanting to fulfill her obligation to her job and her health  I suggested that she try taking her atarax in the evening before the anxiety gets so bad.   Her BP is looking great however- no more high readings like she was having a week or so ago  No cough or fever   She will come in for a visit this coming Thursday at 9 am  She is appreciative of appt

## 2018-09-11 ENCOUNTER — Encounter: Payer: Self-pay | Admitting: Family Medicine

## 2018-09-11 ENCOUNTER — Other Ambulatory Visit: Payer: Self-pay

## 2018-09-11 ENCOUNTER — Ambulatory Visit (HOSPITAL_BASED_OUTPATIENT_CLINIC_OR_DEPARTMENT_OTHER)
Admission: RE | Admit: 2018-09-11 | Discharge: 2018-09-11 | Disposition: A | Discharge status: Home / Self Care | Payer: PPO | Source: Ambulatory Visit | Attending: Family Medicine | Admitting: Family Medicine

## 2018-09-11 ENCOUNTER — Ambulatory Visit (INDEPENDENT_AMBULATORY_CARE_PROVIDER_SITE_OTHER): Payer: PPO | Admitting: Family Medicine

## 2018-09-11 VITALS — BP 136/84 | HR 69 | Temp 98.0°F | Resp 16 | Ht 70.0 in | Wt 181.0 lb

## 2018-09-11 DIAGNOSIS — R1013 Epigastric pain: Secondary | ICD-10-CM | POA: Diagnosis not present

## 2018-09-11 DIAGNOSIS — R0602 Shortness of breath: Secondary | ICD-10-CM

## 2018-09-11 DIAGNOSIS — K219 Gastro-esophageal reflux disease without esophagitis: Secondary | ICD-10-CM

## 2018-09-11 LAB — COMPREHENSIVE METABOLIC PANEL
ALT: 24 U/L (ref 0–35)
AST: 22 U/L (ref 0–37)
Albumin: 4.3 g/dL (ref 3.5–5.2)
Alkaline Phosphatase: 93 U/L (ref 39–117)
BUN: 17 mg/dL (ref 6–23)
CO2: 30 mEq/L (ref 19–32)
Calcium: 9.3 mg/dL (ref 8.4–10.5)
Chloride: 104 mEq/L (ref 96–112)
Creatinine, Ser: 0.69 mg/dL (ref 0.40–1.20)
GFR: 84.59 mL/min (ref >60.00)
Glucose, Bld: 88 mg/dL (ref 70–99)
Potassium: 3.8 mEq/L (ref 3.5–5.1)
Sodium: 141 mEq/L (ref 135–145)
Total Bilirubin: 0.5 mg/dL (ref 0.2–1.2)
Total Protein: 7.2 g/dL (ref 6.0–8.3)

## 2018-09-11 LAB — CBC
HCT: 41.3 % (ref 36.0–46.0)
Hemoglobin: 14.2 g/dL (ref 12.0–15.0)
MCHC: 34.3 g/dL (ref 30.0–36.0)
MCV: 86.6 fl (ref 78.0–100.0)
Platelets: 260 K/uL (ref 150.0–400.0)
RBC: 4.77 Mil/uL (ref 3.87–5.11)
RDW: 12.6 % (ref 11.5–15.5)
WBC: 4.9 K/uL (ref 4.0–10.5)

## 2018-09-11 LAB — TROPONIN I: TNIDX: 0.01 ug/L (ref 0.00–0.06)

## 2018-09-11 MED ORDER — OMEPRAZOLE 40 MG PO CPDR: 40.0000 mg | DELAYED_RELEASE_CAPSULE | Freq: Every day | ORAL | 1 refills | Status: DC

## 2018-09-11 NOTE — Patient Instructions (Signed)
It was great to see you today as always!    Please go to the lab and then to x-ray on the ground floor.  We will check some blood work, and also get a chest x-ray.  I will be in touch of these results ASAP  I think that your chest pressure is likely due to allergies, and perhaps anxiety.  Your EKG is reassuring-no evidence of heart distress.  I am going to do a troponin test today, for further reassurance.  Your chest x-ray will let us examine your lungs in more detail  Please continue to use hydroxyzine as needed for anxiety For allergies, I would suggest a nasal steroid spray such as Flonase, which is available over-the-counter  For your stomach pain, we will increase omeprazole to 40 mg.  Please take this once a day for 4 to 8 weeks

## 2018-09-11 NOTE — Progress Notes (Signed)
[place] at [facility] [address], [place], [place] [address] [phone]  Date:  09/11/2018   Name:  Sherry Butler   DOB:  29-Nov-1950   MRN:  [id number]  PCP:  Darreld Mclean, MD    Chief Complaint: Anxiety (shorntess of breath, anxiety, panick attacks) and Abdominal Pain (bloating, pressure, burning)   History of Present Illness:  Sherry Butler is a 68 y.o. very pleasant female patient who presents with the following:  In person visit today due to anxiety and shortness of breath Sherry Butler has history of hypertension, hyperlipidemia, sleep apnea We did a virtual visit recently due to concern about elevated blood pressure and anxiety.  Sherry Butler has experienced shortness of breath for a month or so, she is not sure if this is due to anxiety or something else I also saw her for high blood pressure back in February  She actually went to the ER on April 9 due to chest discomfort symptoms which were thought to be GERD and anxiety.  She was started on a PPI and released to home She did not have an EKG or chest x-ray at that time  Today Sherry Butler notes that she is feeling "a little anxious but not as bad" since she is taking hydroxyzine prn She feels like her GERD is much better - she is taking omeprazole and feels like it helps her She is now done with taking carafate- the carafate made her feel bad so she did not finish the full 10 days  Her stomach is improved, but still can have some epigastric burning   Her breathing feels good today- she is now thinking that her sx are due to pollen If she is outside in the pollen she will have chest tightness- however if it has just rained and washed the pollen away she does ok  Chlorthalidone 25 1 daily Lisinopril 20 mg 1 daily Prilosec 20 mg Hydroxyzine prn Carafate 1 g 4 times a day x10 days, start on April 9- now DC   Patient Active Problem List   Diagnosis Date Noted  . Left  wrist pain 03/28/2017  . HSV-2 (herpes simplex virus 2) infection 10/24/2016  . Bilateral high frequency sensorineural hearing loss 09/04/2016  . OSA on CPAP 08/23/2016  . Essential hypertension 08/23/2016  . Mild depression ([place]) 08/23/2016  . Vertigo 08/23/2016  . Vitamin D deficiency 02/11/2015  . Anxiety 02/11/2015  . Mixed hyperlipidemia 02/11/2015  . Cataract extraction status 11/17/2014  . Osteopenia 11/09/2014  . Calculus of kidney 11/09/2014  . Other seborrheic keratosis 09/30/2014  . Neovascularization, retina 11/05/2013  . Retinal hemorrhage 11/05/2013  . Retinal macular atrophy 11/05/2013  . Progressive high myopia 11/05/2013  . Nonexudative age-related macular degeneration 11/05/2013  . Low back pain 07/20/2013  . Fatty liver 09/04/2012  . GERD (gastroesophageal reflux disease) 03/06/2012    Past Medical History:  Diagnosis Date  . Brain aneurysm   . Depression   . High cholesterol   . Hypertension   . Kidney calculi   . Kidney stone   . Vertigo     Past Surgical History:  Procedure Laterality Date  . ABDOMINAL HYSTERECTOMY    . EYE SURGERY    . IR 3D INDEPENDENT WKST  11/22/2017  . IR ANGIO INTRA EXTRACRAN SEL COM CAROTID INNOMINATE UNI R MOD SED  11/22/2017  . IR ANGIO INTRA EXTRACRAN SEL INTERNAL CAROTID BILAT MOD SED  11/22/2017  . IR ANGIO  VERTEBRAL SEL VERTEBRAL BILAT MOD SED  11/22/2017  . TUBAL LIGATION      Social History   Tobacco Use  . Smoking status: Former Research scientist (life sciences)  . Smokeless tobacco: Never Used  Substance Use Topics  . Alcohol use: No  . Drug use: Never    No family history on file.  Allergies  Allergen Reactions  . Propoxyphene Nausea And Vomiting and Other (See Comments)    Propoxyphene and Acetaminophen  . Demerol  [Meperidine Hcl] Nausea And Vomiting  . Valium [Diazepam] Other (See Comments)    Unknown    Medication list has been reviewed and updated.  Current Outpatient Medications on File Prior to Visit  Medication Sig  Dispense Refill  . chlorthalidone (HYGROTON) 25 MG tablet Take 1 tablet (25 mg total) by mouth daily. 30 tablet 0  . cholecalciferol (VITAMIN D) 1000 units tablet Take 1,000 Units by mouth daily.    . COD LIVER OIL PO Take 1 capsule by mouth daily.    . Cyanocobalamin (VITAMIN B-12 PO) Take 1 tablet by mouth daily.    . cyclobenzaprine (FLEXERIL) 5 MG tablet Take 1 tablet (5 mg total) by mouth at bedtime. 7 tablet 0  . GARLIC PO Take 1 capsule by mouth daily.    . hydrOXYzine (ATARAX/VISTARIL) 25 MG tablet Take 0.5-1 tablets (12.5-25 mg total) by mouth 3 (three) times daily as needed for anxiety. 60 tablet 0  . lisinopril (PRINIVIL,ZESTRIL) 20 MG tablet Take 1 tablet (20 mg total) by mouth daily. 90 tablet 3  . omeprazole (PRILOSEC) 20 MG capsule Take 1 capsule (20 mg total) by mouth daily. 30 capsule 0  . sucralfate (CARAFATE) 1 g tablet Take 1 tablet (1 g total) by mouth 4 (four) times daily -  with meals and at bedtime. 40 tablet 0   No current facility-administered medications on file prior to visit.     Review of Systems:  No calf tenderness or swelling No immobilization No history of DVT or PE  No fever or chills, no cough  Physical Examination: Vitals:   09/11/18 0851  BP: 136/84  Pulse: 69  Resp: 16  Temp: 98 F (36.7 C)  SpO2: 99%   Vitals:   09/11/18 0851  Weight: 181 lb (82.1 kg)  Height: 5\' 10"  (1.778 m)   Body mass index is 25.97 kg/m. Ideal Body Weight: Weight in (lb) to have BMI = 25: 173.9  GEN: WDWN, NAD, Non-toxic, A & O x 3, normal weight, looks well HEENT: Atraumatic, Normocephalic. Neck supple. No masses, No LAD.  Bilateral TM wnl, oropharynx normal.  PEERL,EOMI.   Ears and Nose: No external deformity. CV: RRR, No M/G/R. No JVD. No thrill. No extra heart sounds. PULM: CTA B, no wheezes, crackles, rhonchi. No retractions. No resp. distress. No accessory muscle use. ABD: S, mild epigastric tenderness, ND, +BS. No rebound. No HSM. EXTR: No c/c/e NEURO  Normal gait.  PSYCH: Normally interactive. Conversant. Not depressed or anxious appearing.  Calm demeanor.   EKG:  SR rate of 57, left axis with anterior fascicular block   Compared with EKG from 2/19 no change is noted   Dg Chest 2 View  Result Date: 09/11/2018 CLINICAL DATA:  Chest pressure and shortness of breath for 1 month. EXAM: CHEST - 2 VIEW COMPARISON:  Single-view of the chest 07/02/2018. PA and lateral chest 06/17/2017. FINDINGS: The lungs are clear. Heart size is normal. Aortic atherosclerosis is noted. No pneumothorax or pleural fluid. No acute or focal bony  abnormality. IMPRESSION: No acute disease. Atherosclerosis. Electronically Signed   By: Inge Rise M.D.   On: 09/11/2018 09:48   Assessment and Plan: Shortness of breath - Plan: EKG 12-Lead, DG Chest 2 View, Troponin I -  Epigastric pain - Plan: CBC, Comprehensive metabolic panel  Gastroesophageal reflux disease, esophagitis presence not specified - Plan: omeprazole (PRILOSEC) 40 MG capsule, DISCONTINUED: omeprazole (PRILOSEC) 40 MG capsule   Patient came into the office today for shortness of breath, which has bothered her on and off for a month or more.  She is also suffering from significant anxiety due to recent pandemic.  She works at a [employer] and does contact the public on a regular basis.  She feels very torn between protecting her health and filling her obligations to her coworkers.  I did write her a note allowing her to take a leave of absence for next 4 weeks if she chooses to do so  Suspect her shortness of breath is due to allergies and/or anxiety.  However we will do some further work-up today to help rule out other more serious etiology.  EKG today is reassuring, she notes no chest pain though she has had epigastric pain thought due to GERD.  We will do a troponin for her as well as a chest x-ray  She continues to have mild epigastric tenderness.  We will increase her dose of omeprazole to 40  mg Encouraged her to add a nasal steroid spray such as Flonase for likely allergic rhinitis Signed Lamar Blinks, MD  Received her labs as below-gave her a call to go over results    Results for orders placed or performed in visit on 09/11/18  CBC  Result Value Ref Range   WBC 4.9 4.0 - 10.5 K/uL   RBC 4.77 3.87 - 5.11 Mil/uL   Platelets 260.0 150.0 - 400.0 K/uL   Hemoglobin 14.2 12.0 - 15.0 g/dL   HCT 41.3 36.0 - 46.0 %   MCV 86.6 78.0 - 100.0 fl   MCHC 34.3 30.0 - 36.0 g/dL   RDW 12.6 11.5 - 15.5 %  Comprehensive metabolic panel  Result Value Ref Range   Sodium 141 135 - 145 mEq/L   Potassium 3.8 3.5 - 5.1 mEq/L   Chloride 104 96 - 112 mEq/L   CO2 30 19 - 32 mEq/L   Glucose, Bld 88 70 - 99 mg/dL   BUN 17 6 - 23 mg/dL   Creatinine, Ser 0.69 0.40 - 1.20 mg/dL   Total Bilirubin 0.5 0.2 - 1.2 mg/dL   Alkaline Phosphatase 93 39 - 117 U/L   AST 22 0 - 37 U/L   ALT 24 0 - 35 U/L   Total Protein 7.2 6.0 - 8.3 g/dL   Albumin 4.3 3.5 - 5.2 g/dL   Calcium 9.3 8.4 - 10.5 mg/dL   GFR 84.59 >60.00 mL/min  Troponin I -  Result Value Ref Range   TNIDX 0.01 0.00 - 0.06 ug/l

## 2018-09-30 ENCOUNTER — Other Ambulatory Visit: Payer: Self-pay

## 2018-09-30 NOTE — Patient Outreach (Signed)
[place] [facility]) Care Management  09/30/2018  Reggie Pla 1951/03/11 [id number]  TELEPHONE SCREENING Referral date: 09/30/18 Referral source: EMMI prevent referral Referral reason: EMMI prevent score Insurance:  [employer] team advantage   Telephone call to patient regarding EMMI prevent referral. HIPAA verified with patient. RNCM introduced herself and explained reason for call.  RNCM discussed and offered [facility] care management services.  Patient states she has high blood pressure that is being managed with her medication.  Patient states she does not feel she needs services at this time. She states she is doing very well.  RNCM offered to mail patient [facility] care management brochure. Patient verbally agreed.  RNCM discussed COVID 19 precautions and symptoms. Advised patient to contact her doctor for minor symptoms. If symptoms more severe call 911.  Patient verbalized understanding.   PLAN: RNCM will close due to patient being assessed and having no further needs.  RNCM will mail patient [facility] care management brochure/ magnet.   Quinn Plowman RN,BSN,CCM [facility] Telephonic  [phone]

## 2018-10-10 ENCOUNTER — Other Ambulatory Visit: Payer: Self-pay

## 2018-10-10 ENCOUNTER — Ambulatory Visit: Payer: PPO | Admitting: Internal Medicine

## 2018-10-10 ENCOUNTER — Telehealth: Payer: Self-pay | Admitting: Family Medicine

## 2018-10-10 DIAGNOSIS — R1013 Epigastric pain: Secondary | ICD-10-CM

## 2018-10-10 NOTE — Telephone Encounter (Signed)
Patient has been taking medicine you gave her but only able to eat vegetable soup, rice, bread and oatmeal. If she eats anything other than that her stomach swells. She is wondering if it is medication related. It has been almost 2 months since all this has started and has not gotten any better. She also wonders if she has a ulcer. Possibly needs a GI doctor?   I have scheduled her to see a provider virtually this afternoon in absence of pcp.

## 2018-10-10 NOTE — Progress Notes (Signed)
Link sent, no answer Called 3 times, no answer  Sherry November, MD 2.47 pm

## 2018-10-10 NOTE — Telephone Encounter (Signed)
I called her back- it looks like she did not connect with Dr. Larose Kells earlier today. I also got no answer.  LMOM that I will refer to GI, let me know if she needs anything further or any more urgent help

## 2018-10-10 NOTE — Telephone Encounter (Signed)
Copied from [place] [id number]. Topic: Quick Communication - See Telephone Encounter >> Oct 10, 2018 10:24 AM Burchel, Abbi R wrote: CRM for notification. See Telephone encounter for: 10/10/18.  Pt requesting a call back from Dr Lorelei Pont re: stomach issues.  Please call pt: [phone]

## 2018-10-15 NOTE — Progress Notes (Addendum)
Virtual Visit via Video Note  I connected with patient on 10/16/18 at  8:00 AM EDT by audio enabled telemedicine application and verified that I am speaking with the correct person using two identifiers.   THIS ENCOUNTER IS A VIRTUAL VISIT DUE TO COVID-19 - PATIENT WAS NOT SEEN IN THE OFFICE. PATIENT HAS CONSENTED TO VIRTUAL VISIT / TELEMEDICINE VISIT   Location of patient: home  Location of provider: office  I discussed the limitations of evaluation and management by telemedicine and the availability of in person appointments. The patient expressed understanding and agreed to proceed.   Subjective:   Sherry Butler is a 68 y.o. female who presents for an Initial Medicare Annual Wellness Visit.  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  Medicare Wellness Virtual Visit.  Visual/audio telehealth visit, UTA vital signs.   See social history for additional risk factors. Cardiac Risk Factors include: advanced age (>48men, >54 women);dyslipidemia;hypertension Sleep patterns: varies. Sleeps well mostly. Home Safety/Smoke Alarms: Feels safe in home. Smoke alarms in place.  Lives with husband and 39yr old granddaughter. 1 story.   Female:   Pap-10/24/16       Mammo-  order     Dexa scan-11/12/16        [facility]S- Cologuard 09/30/17     Objective:    Today's Vitals   10/16/18 0832  BP: 125/74  Pulse: 64   There is no height or weight on file to calculate BMI.  Advanced Directives 10/16/2018 08/28/2018 01/17/2018 11/22/2017 10/18/2017 10/09/2017 10/08/2017  Does Patient Have a Medical Advance Directive? No No No No No No No  Would patient like information on creating a medical advance directive? No - Patient declined Yes (ED - Information included in AVS) - No - Patient declined No - Patient declined - -    Current Medications (verified) Outpatient Encounter Medications as of 10/16/2018  Medication Sig  . hydrOXYzine (ATARAX/VISTARIL) 25 MG tablet  Take 0.5-1 tablets (12.5-25 mg total) by mouth 3 (three) times daily as needed for anxiety.  Marland Kitchen lisinopril (PRINIVIL,ZESTRIL) 20 MG tablet Take 1 tablet (20 mg total) by mouth daily.  Marland Kitchen omeprazole (PRILOSEC) 40 MG capsule Take 1 capsule (40 mg total) by mouth daily. Use for 4- 8 weeks  . chlorthalidone (HYGROTON) 25 MG tablet Take 1 tablet (25 mg total) by mouth daily. (Patient not taking: Reported on 10/16/2018)  . cholecalciferol (VITAMIN D) 1000 units tablet Take 1,000 Units by mouth daily.  . COD LIVER OIL PO Take 1 capsule by mouth daily.  . Cyanocobalamin (VITAMIN B-12 PO) Take 1 tablet by mouth daily.  . cyclobenzaprine (FLEXERIL) 5 MG tablet Take 1 tablet (5 mg total) by mouth at bedtime. (Patient not taking: Reported on 10/16/2018)  . GARLIC PO Take 1 capsule by mouth daily.  . sucralfate (CARAFATE) 1 g tablet Take 1 tablet (1 g total) by mouth 4 (four) times daily -  with meals and at bedtime. (Patient not taking: Reported on 10/16/2018)   No facility-administered encounter medications on file as of 10/16/2018.     Allergies (verified) Propoxyphene; Demerol  [meperidine hcl]; and Valium [diazepam]   History: Past Medical History:  Diagnosis Date  . Brain aneurysm   . Depression   . High cholesterol   . Hypertension   . Kidney calculi   . Kidney stone   . Vertigo    Past Surgical History:  Procedure Laterality Date  . ABDOMINAL HYSTERECTOMY    .  EYE SURGERY    . IR 3D INDEPENDENT WKST  11/22/2017  . IR ANGIO INTRA EXTRACRAN SEL COM CAROTID INNOMINATE UNI R MOD SED  11/22/2017  . IR ANGIO INTRA EXTRACRAN SEL INTERNAL CAROTID BILAT MOD SED  11/22/2017  . IR ANGIO VERTEBRAL SEL VERTEBRAL BILAT MOD SED  11/22/2017  . TUBAL LIGATION     History reviewed. No pertinent family history. Social History   Socioeconomic History  . Marital status: Married    Spouse name: Not on file  . Number of children: Not on file  . Years of education: Not on file  . Highest education level: Not on  file  Occupational History  . Not on file  Social Needs  . Financial resource strain: Not on file  . Food insecurity:    Worry: Not on file    Inability: Not on file  . Transportation needs:    Medical: Not on file    Non-medical: Not on file  Tobacco Use  . Smoking status: Former Research scientist (life sciences)  . Smokeless tobacco: Never Used  Substance and Sexual Activity  . Alcohol use: No  . Drug use: Never  . Sexual activity: Not on file  Lifestyle  . Physical activity:    Days per week: Not on file    Minutes per session: Not on file  . Stress: Not on file  Relationships  . Social connections:    Talks on phone: Not on file    Gets together: Not on file    Attends religious service: Not on file    Active member of club or organization: Not on file    Attends meetings of clubs or organizations: Not on file    Relationship status: Not on file  Other Topics Concern  . Not on file  Social History Narrative  . Not on file    Tobacco Counseling Counseling given: Not Answered   Clinical Intake:     Pain : No/denies pain                  Activities of Daily Living In your present state of health, do you have any difficulty performing the following activities: 10/16/2018  Hearing? N  Vision? N  Comment wears glasses at all times.  Difficulty concentrating or making decisions? N  Walking or climbing stairs? N  Dressing or bathing? N  Doing errands, shopping? N  Preparing Food and eating ? N  Using the Toilet? N  In the past six months, have you accidently leaked urine? N  Do you have problems with loss of bowel control? N  Managing your Medications? N  Managing your Finances? N  Housekeeping or managing your Housekeeping? N  Some recent data might be hidden     Immunizations and Health Maintenance Immunization History  Administered Date(s) Administered  . Influenza,inj,Quad PF,6+ Mos 05/19/2018   Health Maintenance Due  Topic Date Due  . TETANUS/TDAP   08/18/1969  . PNA vac Low Risk Adult (1 of 2 - PCV13) 08/19/2015  . MAMMOGRAM  08/09/2018    Patient Care Team: Copland, Gay Filler, MD as PCP - General (Family Medicine)  Indicate any recent Medical Services you may have received from other than [facility] providers in the past year (date may be approximate).     Assessment:   This is a routine wellness examination for [place]. Physical assessment deferred to PCP.  Hearing/Vision screen Unable to assess. This visit is enabled though telemedicine due to Covid 19.   Dietary  issues and exercise activities discussed: Current Exercise Habits: The patient does not participate in regular exercise at present, Type of exercise: walking(7000 steps per day), Frequency (Times/Week): 7, Intensity: Mild, Exercise limited by: None identified  Goals    . Figure out what is causing her stomach issues.     Has appt with GI tomorrow.      Depression Screen PHQ 2/9 Scores 10/16/2018 01/25/2017  PHQ - 2 Score 0 1    Fall Risk Fall Risk  10/16/2018 01/25/2017  Falls in the past year? 0 No    Cognitive Function: Ad8 score reviewed for issues:  Issues making decisions:no  Less interest in hobbies / activities:no  Repeats questions, stories (family complaining):no  Trouble using ordinary gadgets (microwave, computer, phone):no  Forgets the month or year: no  Mismanaging finances: no  Remembering appts:no  Daily problems with thinking and/or memory:no Ad8 score is=0         Screening Tests Health Maintenance  Topic Date Due  . TETANUS/TDAP  08/18/1969  . PNA vac Low Risk Adult (1 of 2 - PCV13) 08/19/2015  . MAMMOGRAM  08/09/2018  . INFLUENZA VACCINE  12/20/2018  . Fecal DNA (Cologuard)  09/30/2020  . DEXA SCAN  Completed  . Hepatitis C Screening  Completed       Plan:   See you next year.  Continue to eat heart healthy diet (full of fruits, vegetables, whole grains, lean protein, water--limit salt, fat, and sugar intake) and  increase physical activity as tolerated.  Continue doing brain stimulating activities (puzzles, reading, adult coloring books, staying active) to keep memory sharp.   I have ordered your mammogram. Please schedule.  I have personally reviewed and noted the following in the patient's chart:   . Medical and social history . Use of alcohol, tobacco or illicit drugs  . Current medications and supplements . Functional ability and status . Nutritional status . Physical activity . Advanced directives . List of other physicians . Hospitalizations, surgeries, and ER visits in previous 12 months . Vitals . Screenings to include cognitive, depression, and falls . Referrals and appointments  In addition, I have reviewed and discussed with patient certain preventive protocols, quality metrics, and best practice recommendations. A written personalized care plan for preventive services as well as general preventive health recommendations were provided to patient.     Shela Nevin, RN   10/16/2018    I have reviewed the above MWE by Ms. Vevelyn Royals and agree with her documentation Denny Peon MD

## 2018-10-16 ENCOUNTER — Encounter: Payer: Self-pay | Admitting: *Deleted

## 2018-10-16 ENCOUNTER — Ambulatory Visit (INDEPENDENT_AMBULATORY_CARE_PROVIDER_SITE_OTHER): Payer: PPO | Admitting: *Deleted

## 2018-10-16 ENCOUNTER — Other Ambulatory Visit: Payer: Self-pay

## 2018-10-16 VITALS — BP 125/74 | HR 64

## 2018-10-16 DIAGNOSIS — Z1231 Encounter for screening mammogram for malignant neoplasm of breast: Secondary | ICD-10-CM

## 2018-10-16 DIAGNOSIS — Z0000 Encounter for general adult medical examination without abnormal findings: Secondary | ICD-10-CM | POA: Diagnosis not present

## 2018-10-16 NOTE — Patient Instructions (Signed)
See you next year.  Continue to eat heart healthy diet (full of fruits, vegetables, whole grains, lean protein, water--limit salt, fat, and sugar intake) and increase physical activity as tolerated.  Continue doing brain stimulating activities (puzzles, reading, adult coloring books, staying active) to keep memory sharp.   I have ordered your mammogram. Please schedule.   Sherry Butler , Thank you for taking time to come for your Medicare Wellness Visit. I appreciate your ongoing commitment to your health goals. Please review the following plan we discussed and let me know if I can assist you in the future.   These are the goals we discussed: Goals    . Figure out what is causing her stomach issues.     Has appt with GI tomorrow.       This is a list of the screening recommended for you and due dates:  Health Maintenance  Topic Date Due  . Tetanus Vaccine  08/18/1969  . Pneumonia vaccines (1 of 2 - PCV13) 08/19/2015  . Mammogram  08/09/2018  . Flu Shot  12/20/2018  . Cologuard (Stool DNA test)  09/30/2020  . DEXA scan (bone density measurement)  Completed  .  Hepatitis C: One time screening is recommended by Center for Disease Control  (CDC) for  adults born from [birth year] through 1965.   Completed    Health Maintenance After Age 58 After age 49, you are at a higher risk for certain long-term diseases and infections as well as injuries from falls. Falls are a major cause of broken bones and head injuries in people who are older than age 28. Getting regular preventive care can help to keep you healthy and well. Preventive care includes getting regular testing and making lifestyle changes as recommended by your health care provider. Talk with your health care provider about:  Which screenings and tests you should have. A screening is a test that checks for a disease when you have no symptoms.  A diet and exercise plan that is right for you. What should I know about screenings and tests to  prevent falls? Screening and testing are the best ways to find a health problem early. Early diagnosis and treatment give you the best chance of managing medical conditions that are common after age 50. Certain conditions and lifestyle choices may make you more likely to have a fall. Your health care provider may recommend:  Regular vision checks. Poor vision and conditions such as cataracts can make you more likely to have a fall. If you wear glasses, make sure to get your prescription updated if your vision changes.  Medicine review. Work with your health care provider to regularly review all of the medicines you are taking, including over-the-counter medicines. Ask your health care provider about any side effects that may make you more likely to have a fall. Tell your health care provider if any medicines that you take make you feel dizzy or sleepy.  Osteoporosis screening. Osteoporosis is a condition that causes the bones to get weaker. This can make the bones weak and cause them to break more easily.  Blood pressure screening. Blood pressure changes and medicines to control blood pressure can make you feel dizzy.  Strength and balance checks. Your health care provider may recommend certain tests to check your strength and balance while standing, walking, or changing positions.  Foot health exam. Foot pain and numbness, as well as not wearing proper footwear, can make you more likely to have a fall.  Depression screening. You may be more likely to have a fall if you have a fear of falling, feel emotionally low, or feel unable to do activities that you used to do.  Alcohol use screening. Using too much alcohol can affect your balance and may make you more likely to have a fall. What actions can I take to lower my risk of falls? General instructions  Talk with your health care provider about your risks for falling. Tell your health care provider if: ? You fall. Be sure to tell your health  care provider about all falls, even ones that seem minor. ? You feel dizzy, sleepy, or off-balance.  Take over-the-counter and prescription medicines only as told by your health care provider. These include any supplements.  Eat a healthy diet and maintain a healthy weight. A healthy diet includes low-fat dairy products, low-fat (lean) meats, and fiber from whole grains, beans, and lots of fruits and vegetables. Home safety  Remove any tripping hazards, such as rugs, cords, and clutter.  Install safety equipment such as grab bars in bathrooms and safety rails on stairs.  Keep rooms and walkways well-lit. Activity   Follow a regular exercise program to stay fit. This will help you maintain your balance. Ask your health care provider what types of exercise are appropriate for you.  If you need a cane or walker, use it as recommended by your health care provider.  Wear supportive shoes that have nonskid soles. Lifestyle  Do not drink alcohol if your health care provider tells you not to drink.  If you drink alcohol, limit how much you have: ? 0-1 drink a day for women. ? 0-2 drinks a day for men.  Be aware of how much alcohol is in your drink. In the [place] one drink equals one typical bottle of beer (12 oz), one-half glass of wine (5 oz), or one shot of hard liquor (1 oz).  Do not use any products that contain nicotine or tobacco, such as cigarettes and e-cigarettes. If you need help quitting, ask your health care provider. Summary  Having a healthy lifestyle and getting preventive care can help to protect your health and wellness after age 7.  Screening and testing are the best way to find a health problem early and help you avoid having a fall. Early diagnosis and treatment give you the best chance for managing medical conditions that are more common for people who are older than age 69.  Falls are a major cause of broken bones and head injuries in people who are older than age  95. Take precautions to prevent a fall at home.  Work with your health care provider to learn what changes you can make to improve your health and wellness and to prevent falls. This information is not intended to replace advice given to you by your health care provider. Make sure you discuss any questions you have with your health care provider. Document Released: 03/20/2017 Document Revised: 03/20/2017 Document Reviewed: 03/20/2017 Elsevier Interactive Patient Education  2019 [employer].

## 2018-10-17 ENCOUNTER — Other Ambulatory Visit: Payer: Self-pay

## 2018-10-17 ENCOUNTER — Encounter: Payer: Self-pay | Admitting: Gastroenterology

## 2018-10-17 ENCOUNTER — Telehealth (INDEPENDENT_AMBULATORY_CARE_PROVIDER_SITE_OTHER): Payer: PPO | Admitting: Gastroenterology

## 2018-10-17 VITALS — BP 122/71 | HR 68 | Ht 70.0 in | Wt 180.0 lb

## 2018-10-17 DIAGNOSIS — K219 Gastro-esophageal reflux disease without esophagitis: Secondary | ICD-10-CM

## 2018-10-17 DIAGNOSIS — Z1211 Encounter for screening for malignant neoplasm of colon: Secondary | ICD-10-CM

## 2018-10-17 DIAGNOSIS — F419 Anxiety disorder, unspecified: Secondary | ICD-10-CM

## 2018-10-17 DIAGNOSIS — R140 Abdominal distension (gaseous): Secondary | ICD-10-CM

## 2018-10-17 MED ORDER — OMEPRAZOLE 40 MG PO CPDR: DELAYED_RELEASE_CAPSULE | ORAL | 0 refills | Status: DC

## 2018-10-17 NOTE — Progress Notes (Signed)
Chief Complaint: Abdominal bloating, abdominal distention, GERD  Referring Provider:     Darreld Mclean, MD   HPI:    Due to current restrictions/limitations of in-office visits due to the COVID-19 pandemic, this scheduled clinical appointment was converted to a telehealth virtual consultation.  -Time of medical discussion: 30 minutes -The patient did consent to this virtual visit and is aware of possible charges through their insurance for this visit.  -Names of all parties present: Sherry Butler (patient), Gerrit Heck, DO, [facility] (physician) -Patient location: Home -Physician location: Office  Interactive audio and video telecommunications were attempted between this provider and patient, however failed, due to patient did not have access to video capability. We continued and completed visit with audio only.  Sherry Butler is a 68 y.o. female with a history of anxiety, OSA, HTN, referred to the [facility] for evaluation of abdominal bloating, abdominal pressure, epigastric pain.  Symptoms started nearly 2 months ago.  She describes post prandial bloating, independent of food types, and loose, non-bloody stools.  No frank diarrhea.  No increase stool frequency, just change in consistency.  Bloating lasts 1-2 hours post PO. Improved with belching. Not frank pain. Has reduced PO intake to try to limit sxs (not loss of appetite or early satiety). Has changed to bland diet for last 6 weeks or so, but still with post prandial bloat and visible distension. Has gained 3# in last 3 months due to limitations related to COVID-19. No night sweats. No hematocezia, melena, n/v. No prior similar sxs. Patient is otherwise without preceding exposures to include recent antibiotics, hospitalization, sick contacts, travel and denies new medications, supplements, OTCs.  Additionally with reflux sxs of HB, regurgitation. Worse at night and post prandial, again independent  of food types (although worse with ice cream, mayonnaise, gravy, tomato-based sauces). Increased belching. Has had reflux sxs in the past, but worse lately. No dysphagia or odynophagia. Had previously taken prn Rolaids with good effect. Has since started Prilosec 40 mg QAM as below, with improvement in sxs (along with dietary mods).   Was seen in the ER for similar symptoms on 08/28/2018, diagnosed with anxiety and reflux.  Evaluation otherwise unrevealing.  Started omeprazole and Carafate.  Some improvement with Prilosec, but felt that the Carafate worsened her GI symptoms, so stopped this.  Was seen by her PCM on 09/11/2018, and increased Prilosec to 40 mg daily.  Troponin, CBC and CMP normal.  However, given ongoing symptoms, was referred to GI for further evaluation.  Has Rx for Hydroxizine for anxiety, but does not notice change in GI sxs when she takes this (takes 1/2 tab/day).   No recent abdominal imaging for review. No prior EGD. Colonoscopy approx 9 years ago at outside facility in [place], [place]: normal per patient.   No known family history of CRC, GI malignancy, liver disease, pancreatic disease, or IBD.   Past medical history, past surgical history, social history, family history, medications, and allergies reviewed in the chart and with patient.    Past Medical History:  Diagnosis Date  . Brain aneurysm   . Depression   . High cholesterol   . Hypertension   . Kidney calculi   . Kidney stone   . Vertigo      Past Surgical History:  Procedure Laterality Date  . ABDOMINAL HYSTERECTOMY    . COLONOSCOPY     approximately 9 years ago. Michela Pitcher they told  her 10 years. In [place].  Marland Kitchen EYE SURGERY    . IR 3D INDEPENDENT WKST  11/22/2017  . IR ANGIO INTRA EXTRACRAN SEL COM CAROTID INNOMINATE UNI R MOD SED  11/22/2017  . IR ANGIO INTRA EXTRACRAN SEL INTERNAL CAROTID BILAT MOD SED  11/22/2017  . IR ANGIO VERTEBRAL SEL VERTEBRAL BILAT MOD SED  11/22/2017  . TUBAL LIGATION     Family  History  Problem Relation Age of Onset  . Colon cancer Neg Hx   . Esophageal cancer Neg Hx    Social History   Tobacco Use  . Smoking status: Former Research scientist (life sciences)  . Smokeless tobacco: Never Used  . Tobacco comment: quit over 35 years ago. Light smoker  Substance Use Topics  . Alcohol use: No  . Drug use: Never   Current Outpatient Medications  Medication Sig Dispense Refill  . hydrOXYzine (ATARAX/VISTARIL) 25 MG tablet Take 0.5-1 tablets (12.5-25 mg total) by mouth 3 (three) times daily as needed for anxiety. 60 tablet 0  . lisinopril (PRINIVIL,ZESTRIL) 20 MG tablet Take 1 tablet (20 mg total) by mouth daily. 90 tablet 3  . Multiple Vitamins-Minerals (WOMENS MULTIVITAMIN PO) Take 2 tablets by mouth daily. Take 2 gummies    . omeprazole (PRILOSEC) 40 MG capsule Take 1 capsule (40 mg total) by mouth daily. Use for 4- 8 weeks 30 capsule 1  . chlorthalidone (HYGROTON) 25 MG tablet Take 1 tablet (25 mg total) by mouth daily. (Patient not taking: Reported on 10/16/2018) 30 tablet 0  . cholecalciferol (VITAMIN D) 1000 units tablet Take 1,000 Units by mouth daily.    . COD LIVER OIL PO Take 1 capsule by mouth daily.    . Cyanocobalamin (VITAMIN B-12 PO) Take 1 tablet by mouth daily.    . cyclobenzaprine (FLEXERIL) 5 MG tablet Take 1 tablet (5 mg total) by mouth at bedtime. (Patient not taking: Reported on 10/16/2018) 7 tablet 0  . GARLIC PO Take 1 capsule by mouth daily.    . sucralfate (CARAFATE) 1 g tablet Take 1 tablet (1 g total) by mouth 4 (four) times daily -  with meals and at bedtime. (Patient not taking: Reported on 10/16/2018) 40 tablet 0   No current facility-administered medications for this visit.    Allergies  Allergen Reactions  . Propoxyphene Nausea And Vomiting and Other (See Comments)    Propoxyphene and Acetaminophen  . Demerol  [Meperidine Hcl] Nausea And Vomiting  . Valium [Diazepam] Other (See Comments)    Unknown     Review of Systems: All systems reviewed and negative  except where noted in HPI.     Physical Exam:    Physical exam not completed due to the nature of this telehealth communication.  Patient was otherwise alert and oriented and well communicative.   ASSESSMENT AND PLAN;   1) GERD:  - Increase Prilosec to 40 mg p.o. twice daily x4 weeks for diagnostic/therapeutic intent, then reduce back down to 40 mg daily -Resume antireflux lifestyle measures -EGD to evaluate for erosive esophagitis, LES laxity, hiatal hernia etc.  2) Bloating 3) Abdominal Distension 4) Anxiety  Suspect abdominal bloating/distention are related to her underlying anxiety, as these seem to be playing off each other.  Discussed neuromodulation at length and will proceed as below:  -Emailed copy of a low FODMAP diet to start - Briefly discussed trial of Bentyl, but holding off for now - EGD with gastric and duodenal biopsies -Check celiac panel and vitamin D - Holding off on starting  TCA, SSRI, SNRI for now in favor of education reassurance pending the above studies -Discussed colonoscopy, but declined in favor of the above at this juncture.  Can always revisit as needed  5) CRC screening: Reports that she had a colonoscopy done approximately 9 years ago in [place], which she reports was normal. -Obtain copy of previous colonoscopy report with repeat colonoscopy for routine CRC screening per interval recommended and date study was done   The indications, risks, and benefits of EGD were explained to the patient in detail. Risks include but are not limited to bleeding, perforation, adverse reaction to medications, and cardiopulmonary compromise. Sequelae include but are not limited to the possibility of surgery, hositalization, and mortality. The patient verbalized understanding and wished to proceed. All questions answered, referred to scheduler. Further recommendations pending results of the exam.   Lavena Bullion, DO, FACG  10/17/2018, 1:32 PM   Copland,  Gay Filler, MD

## 2018-10-17 NOTE — Patient Instructions (Signed)
If you are age 68 or older, your body mass index should be between 23-30. Your Body mass index is 25.83 kg/m. If this is out of the aforementioned range listed, please consider follow up with your Primary Care Provider.  If you are age 62 or younger, your body mass index should be between 19-25. Your Body mass index is 25.83 kg/m. If this is out of the aformentioned range listed, please consider follow up with your Primary Care Provider.   You have been scheduled for an endoscopy. Please follow written instructions given to you at your visit today. If you use inhalers (even only as needed), please bring them with you on the day of your procedure. Your physician has requested that you go to www.startemmi.com and enter the access code given to you at your visit today. This web site gives a general overview about your procedure. However, you should still follow specific instructions given to you by our office regarding your preparation for the procedure.  To help prevent the possible spread of infection to our patients, communities, and staff; we will be implementing the following measures:  As of now we are not allowing any visitors/family members to accompany you to any upcoming appointments with [facility]. If you have any concerns about this please contact our office to discuss prior to the appointment.   Your provider has requested that you go to the basement level for lab work at [facility] in [place] ([place]) . Press "B" on the elevator. The lab is located at the first door on the left as you exit the elevator.  Please follow up in 3-6 months.   It was a pleasure to see you today!  Vito Cirigliano, D.O.

## 2018-10-20 ENCOUNTER — Other Ambulatory Visit (INDEPENDENT_AMBULATORY_CARE_PROVIDER_SITE_OTHER): Payer: PPO

## 2018-10-20 DIAGNOSIS — F419 Anxiety disorder, unspecified: Secondary | ICD-10-CM | POA: Diagnosis not present

## 2018-10-20 DIAGNOSIS — Z1211 Encounter for screening for malignant neoplasm of colon: Secondary | ICD-10-CM

## 2018-10-20 DIAGNOSIS — R140 Abdominal distension (gaseous): Secondary | ICD-10-CM

## 2018-10-20 DIAGNOSIS — K219 Gastro-esophageal reflux disease without esophagitis: Secondary | ICD-10-CM | POA: Diagnosis not present

## 2018-10-20 LAB — VITAMIN D 25 HYDROXY (VIT D DEFICIENCY, FRACTURES): VITD: 33.57 ng/mL (ref 30.00–100.00)

## 2018-10-20 NOTE — Progress Notes (Signed)
Addendum: Received per colonoscopy report from [facility] in [place], dated 08/26/2008.  Colonoscopy was normal, with recommendation to repeat in 10 years.  - Per previous colonoscopy recommendations and per current guidelines, plan for repeat colonoscopy this year, 2020.

## 2018-10-21 ENCOUNTER — Encounter: Payer: Self-pay | Admitting: Gastroenterology

## 2018-10-24 LAB — RETICULIN ANTIBODIES, IGA W TITER: Reticulin IGA Screen: NEGATIVE

## 2018-10-24 LAB — TISSUE TRANSGLUTAMINASE ABS,IGG,IGA
(tTG) Ab, IgA: 1 U/mL
(tTG) Ab, IgG: 2 U/mL

## 2018-10-24 LAB — GLIADIN ANTIBODIES, SERUM
Gliadin IgA: 5 Units
Gliadin IgG: 2 Units

## 2018-10-31 ENCOUNTER — Telehealth: Payer: Self-pay | Admitting: Gastroenterology

## 2018-10-31 NOTE — Telephone Encounter (Signed)
Patient called said she was given to options for her prep and is confused. Would like to speak to someone

## 2018-10-31 NOTE — Telephone Encounter (Signed)
Patient was confused because she did not pick up her instructions when she picked up her prep. She is coming to get the instructions now.

## 2018-11-03 ENCOUNTER — Telehealth: Payer: Self-pay | Admitting: *Deleted

## 2018-11-03 NOTE — Telephone Encounter (Signed)
No answer for first COVID screen attempt. Will call back. Sm

## 2018-11-04 ENCOUNTER — Other Ambulatory Visit: Payer: Self-pay

## 2018-11-04 ENCOUNTER — Encounter: Payer: Self-pay | Admitting: *Deleted

## 2018-11-04 ENCOUNTER — Ambulatory Visit: Payer: PPO | Admitting: Gastroenterology

## 2018-11-04 NOTE — Progress Notes (Signed)
Patient didn't complete prep.  Patient has agreed to reschedule procedure for a later date and will complete prep.

## 2018-11-04 NOTE — Progress Notes (Signed)
Nancy Campbell, LPN- Temp Judy Branson, CMA- Vitals 

## 2018-11-04 NOTE — Progress Notes (Signed)
Prep instructions printed and reviewed with pt,verbalize understanding. Prep changed from clenpiq to suprep,sample given to pt.

## 2018-11-05 ENCOUNTER — Ambulatory Visit (HOSPITAL_BASED_OUTPATIENT_CLINIC_OR_DEPARTMENT_OTHER)
Admission: RE | Admit: 2018-11-05 | Discharge: 2018-11-05 | Disposition: A | Discharge status: Home / Self Care | Payer: PPO | Source: Ambulatory Visit | Attending: Family Medicine | Admitting: Family Medicine

## 2018-11-05 DIAGNOSIS — Z1231 Encounter for screening mammogram for malignant neoplasm of breast: Secondary | ICD-10-CM | POA: Insufficient documentation

## 2018-11-19 DIAGNOSIS — G4733 Obstructive sleep apnea (adult) (pediatric): Secondary | ICD-10-CM | POA: Diagnosis not present

## 2018-11-23 DIAGNOSIS — R1084 Generalized abdominal pain: Secondary | ICD-10-CM | POA: Diagnosis not present

## 2018-11-23 DIAGNOSIS — A09 Infectious gastroenteritis and colitis, unspecified: Secondary | ICD-10-CM | POA: Diagnosis not present

## 2018-11-24 ENCOUNTER — Telehealth: Payer: Self-pay | Admitting: Gastroenterology

## 2018-11-24 NOTE — Telephone Encounter (Signed)
Pt is scheduled for an EGD/col this Friday and reported that she is having blood in stool and severe abd pain since Saturday 11/21/18.

## 2018-11-24 NOTE — Telephone Encounter (Signed)
Please advise 

## 2018-11-25 ENCOUNTER — Telehealth: Payer: Self-pay | Admitting: Gastroenterology

## 2018-11-25 ENCOUNTER — Other Ambulatory Visit: Payer: PPO

## 2018-11-25 DIAGNOSIS — R140 Abdominal distension (gaseous): Secondary | ICD-10-CM

## 2018-11-25 NOTE — Telephone Encounter (Signed)
Called and spoke with patient-patient is aware of lab work being requested to be done and verbalized understanding of lab work being done at [facility]; patient reports she will complete lab work today; patient also aware to increase oral fluids and to stop Lomotil and to quickly taper Prednisone; patient is already scheduled for procedure with Dr. Bryan Lemma on 11/28/2018 @ 7:30am;

## 2018-11-25 NOTE — Telephone Encounter (Signed)
Please see additional documentation concerning this patient 

## 2018-11-25 NOTE — Telephone Encounter (Signed)
Spoke with [name] today. Had abdominal cramping starting 3 days ago (Saturday) followed by 5-6 BMs that day/evening. Was initially non-bloody, then had BRBPR in the evening and into the next AM. Went to UC on Sunday. Continued to have intermittent sharp abdominal pain, lasting 1 min or so. Was given Rx by UC for Prednisone burst (tapering now), anti-diarrheal (Lomotil), and anti-spasmotic (Bentyl?), and Metronidazole/Cipro (has not taken). Now with mucus-like stools with clots. No labs or imaging done. Tolerating PO without issue. No n/v/f/c.   She is scheduled for EGD/Colonoscopy already this Friday.   - Stop Lomotil - Send to lab today for CBC, CMP, ESR, CRP, GI PCR panel, fecal calprotectin - Complete steroid with quick taper - Hold off on Abx pending stool studies  - Resume plenty of fluids - Proceed with EGD/Colonoscopy as scheduled

## 2018-11-25 NOTE — Telephone Encounter (Signed)
Patient is follow up on previous msg.

## 2018-11-26 ENCOUNTER — Other Ambulatory Visit: Payer: PPO

## 2018-11-26 DIAGNOSIS — R140 Abdominal distension (gaseous): Secondary | ICD-10-CM

## 2018-11-26 LAB — CBC WITH DIFFERENTIAL/PLATELET
Absolute Monocytes: 74 cells/uL — ABNORMAL LOW (ref 200–950)
Basophils Absolute: 12 cells/uL (ref 0–200)
Basophils Relative: 0.1 %
Eosinophils Absolute: 0 cells/uL — ABNORMAL LOW (ref 15–500)
Eosinophils Relative: 0 %
HCT: 40.7 % (ref 35.0–45.0)
Hemoglobin: 13.6 g/dL (ref 11.7–15.5)
Lymphs Abs: 1004 cells/uL (ref 850–3900)
MCH: 29.4 pg (ref 27.0–33.0)
MCHC: 33.4 g/dL (ref 32.0–36.0)
MCV: 87.9 fL (ref 80.0–100.0)
MPV: 11 fL (ref 7.5–12.5)
Monocytes Relative: 0.6 %
Neutro Abs: 11309 cells/uL — ABNORMAL HIGH (ref 1500–7800)
Neutrophils Relative %: 91.2 %
Platelets: 342 10*3/uL (ref 140–400)
RBC: 4.63 10*6/uL (ref 3.80–5.10)
RDW: 12.5 % (ref 11.0–15.0)
Total Lymphocyte: 8.1 %
WBC: 12.4 10*3/uL — ABNORMAL HIGH (ref 3.8–10.8)

## 2018-11-26 LAB — COMPREHENSIVE METABOLIC PANEL
AG Ratio: 1.6 (calc) (ref 1.0–2.5)
ALT: 15 U/L (ref 6–29)
AST: 15 U/L (ref 10–35)
Albumin: 4.5 g/dL (ref 3.6–5.1)
Alkaline phosphatase (APISO): 88 U/L (ref 37–153)
BUN: 24 mg/dL (ref 7–25)
CO2: 27 mmol/L (ref 20–32)
Calcium: 10 mg/dL (ref 8.6–10.4)
Chloride: 105 mmol/L (ref 98–110)
Creat: 0.69 mg/dL (ref 0.50–0.99)
Globulin: 2.9 g/dL (calc) (ref 1.9–3.7)
Glucose, Bld: 147 mg/dL — ABNORMAL HIGH (ref 65–99)
Potassium: 4.3 mmol/L (ref 3.5–5.3)
Sodium: 140 mmol/L (ref 135–146)
Total Bilirubin: 0.6 mg/dL (ref 0.2–1.2)
Total Protein: 7.4 g/dL (ref 6.1–8.1)

## 2018-11-26 LAB — C-REACTIVE PROTEIN: CRP: 33.2 mg/L — ABNORMAL HIGH (ref <8.0)

## 2018-11-26 LAB — SEDIMENTATION RATE: Sed Rate: 28 mm/h (ref 0–30)

## 2018-11-27 ENCOUNTER — Telehealth: Payer: Self-pay | Admitting: Gastroenterology

## 2018-11-27 NOTE — Telephone Encounter (Signed)
Called and spoke with patient-informed patient that stool samples have not been resulted and as soon as they were and the Md had reviewed them should would be notified of results; patient is still planning on coming for procedure on 11/28/2018 unless this process is not necessary from a medical standpoint (lab work);  Please advise if the patient does not need to come for her procedure tomorrow

## 2018-11-27 NOTE — Telephone Encounter (Signed)

## 2018-11-28 ENCOUNTER — Encounter: Payer: Self-pay | Admitting: Gastroenterology

## 2018-11-28 ENCOUNTER — Ambulatory Visit (AMBULATORY_SURGERY_CENTER): Payer: PPO | Admitting: Gastroenterology

## 2018-11-28 ENCOUNTER — Other Ambulatory Visit: Payer: Self-pay

## 2018-11-28 ENCOUNTER — Telehealth: Payer: Self-pay

## 2018-11-28 VITALS — BP 116/73 | HR 60 | Temp 98.5°F | Resp 19 | Ht 70.0 in | Wt 180.0 lb

## 2018-11-28 DIAGNOSIS — D120 Benign neoplasm of cecum: Secondary | ICD-10-CM

## 2018-11-28 DIAGNOSIS — K621 Rectal polyp: Secondary | ICD-10-CM | POA: Diagnosis not present

## 2018-11-28 DIAGNOSIS — Z91018 Allergy to other foods: Secondary | ICD-10-CM

## 2018-11-28 DIAGNOSIS — Z1211 Encounter for screening for malignant neoplasm of colon: Secondary | ICD-10-CM

## 2018-11-28 DIAGNOSIS — R197 Diarrhea, unspecified: Secondary | ICD-10-CM

## 2018-11-28 DIAGNOSIS — R140 Abdominal distension (gaseous): Secondary | ICD-10-CM

## 2018-11-28 DIAGNOSIS — K219 Gastro-esophageal reflux disease without esophagitis: Secondary | ICD-10-CM

## 2018-11-28 DIAGNOSIS — K921 Melena: Secondary | ICD-10-CM

## 2018-11-28 DIAGNOSIS — K6289 Other specified diseases of anus and rectum: Secondary | ICD-10-CM | POA: Diagnosis not present

## 2018-11-28 DIAGNOSIS — K529 Noninfective gastroenteritis and colitis, unspecified: Secondary | ICD-10-CM

## 2018-11-28 DIAGNOSIS — K317 Polyp of stomach and duodenum: Secondary | ICD-10-CM | POA: Diagnosis not present

## 2018-11-28 DIAGNOSIS — D128 Benign neoplasm of rectum: Secondary | ICD-10-CM

## 2018-11-28 MED ORDER — SODIUM CHLORIDE 0.9 % IV SOLN: 500.0000 mL | Freq: Once | INTRAVENOUS | Status: AC

## 2018-11-28 NOTE — Telephone Encounter (Signed)
Copied from [facility] #[id number]. Topic: Referral - Request for Referral >> Nov 28, 2018 12:32 PM Sherry Butler wrote: Pt states Dr Lorelei Pont knows about her stomach issues, and pt would like referral to allergist for testing.

## 2018-11-28 NOTE — Patient Instructions (Signed)
Impression/Recommendations:  Hemorrhoid handout given to patient. Polyp handout given to patient.    Resume previous diet. Continue present medications.  Await pathology results.  Return to GI clinic at appointment to be scheduled.    Repeat colonoscopy for surveillance.  Plan to reduce acid suppression therapy to the lowest effective dose for ongoing control of reflux symptoms.  YOU HAD AN ENDOSCOPIC PROCEDURE TODAY AT [facility]:   Refer to the procedure report that was given to you for any specific questions about what was found during the examination.  If the procedure report does not answer your questions, please call your gastroenterologist to clarify.  If you requested that your care partner not be given the details of your procedure findings, then the procedure report has been included in a sealed envelope for you to review at your convenience later.  YOU SHOULD EXPECT: Some feelings of bloating in the abdomen. Passage of more gas than usual.  Walking can help get rid of the air that was put into your GI tract during the procedure and reduce the bloating. If you had a lower endoscopy (such as a colonoscopy or flexible sigmoidoscopy) you may notice spotting of blood in your stool or on the toilet paper. If you underwent a bowel prep for your procedure, you may not have a normal bowel movement for a few days.  Please Note:  You might notice some irritation and congestion in your nose or some drainage.  This is from the oxygen used during your procedure.  There is no need for concern and it should clear up in a day or so.  SYMPTOMS TO REPORT IMMEDIATELY:   Following lower endoscopy (colonoscopy or flexible sigmoidoscopy):  Excessive amounts of blood in the stool  Significant tenderness or worsening of abdominal pains  Swelling of the abdomen that is new, acute  Fever of 100F or higher   Following upper endoscopy (EGD)  Vomiting of blood or coffee ground  material  New chest pain or pain under the shoulder blades  Painful or persistently difficult swallowing  New shortness of breath  Fever of 100F or higher  Black, tarry-looking stools  For urgent or emergent issues, a gastroenterologist can be reached at any hour by calling [phone].   DIET:  We do recommend a small meal at first, but then you may proceed to your regular diet.  Drink plenty of fluids but you should avoid alcoholic beverages for 24 hours.  ACTIVITY:  You should plan to take it easy for the rest of today and you should NOT DRIVE or use heavy machinery until tomorrow (because of the sedation medicines used during the test).    FOLLOW UP: Our staff will call the number listed on your records 48-72 hours following your procedure to check on you and address any questions or concerns that you may have regarding the information given to you following your procedure. If we do not reach you, we will leave a message.  We will attempt to reach you two times.  During this call, we will ask if you have developed any symptoms of COVID 19. If you develop any symptoms (ie: fever, flu-like symptoms, shortness of breath, cough etc.) before then, please call [phone].  If you test positive for Covid 19 in the 2 weeks post procedure, please call and report this information to [place].    If any biopsies were taken you will be contacted by phone or by letter within the next 1-3 weeks.  Please call us at [phone] if you have not heard about the biopsies in 3 weeks.    SIGNATURES/CONFIDENTIALITY: You and/or your care partner have signed paperwork which will be entered into your electronic medical record.  These signatures attest to the fact that that the information above on your After Visit Summary has been reviewed and is understood.  Full responsibility of the confidentiality of this discharge information lies with you and/or your care-partner.

## 2018-11-28 NOTE — Op Note (Signed)
[facility] Patient Name: Sherry Butler Procedure Date: 11/28/2018 7:31 AM MRN: [id number] Endoscopist: Gerrit Heck , MD Age: 68 Referring MD:  Date of Birth: 1951/02/14 Gender: Female Account #: [id number] Procedure:                Colonoscopy Indications:              Hematochezia, Diarrhea of presumed infectious origin                           68 yo female with acute onset diarrhea adn                            hematochezia in the last week, empirically started                            on Prednisone at Urgent Care. Subsequent labs                            notable for mildly elevated WBC, elevated CRP.                            Stool studies pending. No prior similar symptoms.                            Last colonoscopy was 9+ years ago. Medicines:                Monitored Anesthesia Care Procedure:                Pre-Anesthesia Assessment:                           - Prior to the procedure, a History and Physical                            was performed, and patient medications and                            allergies were reviewed. The patient's tolerance of                            previous anesthesia was also reviewed. The risks                            and benefits of the procedure and the sedation                            options and risks were discussed with the patient.                            All questions were answered, and informed consent                            was obtained. Prior Anticoagulants: The patient has  taken no previous anticoagulant or antiplatelet                            agents. ASA Grade Assessment: II - A patient with                            mild systemic disease. After reviewing the risks                            and benefits, the patient was deemed in                            satisfactory condition to undergo the procedure.                           After obtaining informed consent,  the colonoscope                            was passed under direct vision. Throughout the                            procedure, the patient's blood pressure, pulse, and                            oxygen saturations were monitored continuously. The                            Colonoscope was introduced through the anus and                            advanced to the the terminal ileum. The colonoscopy                            was performed without difficulty. The patient                            tolerated the procedure well. The quality of the                            bowel preparation was adequate. The terminal ileum,                            ileocecal valve, appendiceal orifice, and rectum                            were photographed. Scope In: 7:50:45 AM Scope Out: 8:16:24 AM Scope Withdrawal Time: 0 hours 15 minutes 56 seconds  Total Procedure Duration: 0 hours 25 minutes 39 seconds  Findings:                 Hemorrhoids were found on perianal exam.                           A 3 mm polyp was found in the cecum. The polyp was  sessile. The polyp was removed with a cold biopsy                            forceps. Resection and retrieval were complete.                            Estimated blood loss was minimal.                           A 2 mm polyp was found in the rectum. The polyp was                            sessile. The polyp was removed with a cold biopsy                            forceps. Resection and retrieval were complete.                            Estimated blood loss was minimal.                           Localized moderate inflammation characterized by                            congestion (edema), erythema and shallow                            ulcerations was found in the sigmoid colon, located                            25-35 cm from the anal verge. Biopsies were taken                            with a cold forceps for histology.  Estimated blood                            loss was minimal.                           Non-bleeding internal hemorrhoids were found during                            retroflexion. The hemorrhoids were small.                           Normal mucosa was found in the descending colon, in                            the transverse colon and in the ascending colon.                            Biopsies for histology were taken with a cold  forceps from the right colon and left colon for                            evaluation of microscopic colitis. Estimated blood                            loss was minimal.                           The terminal ileum appeared normal. Complications:            No immediate complications. Estimated Blood Loss:     Estimated blood loss was minimal. Impression:               - Hemorrhoids found on perianal exam.                           - One 3 mm polyp in the cecum, removed with a cold                            biopsy forceps. Resected and retrieved.                           - One 2 mm polyp in the rectum, removed with a cold                            biopsy forceps. Resected and retrieved.                           - 10 cm segment of localized moderate inflammation                            with ulceration was found in the sigmoid colon.                            Biopsied.                           - Non-bleeding internal hemorrhoids.                           - Normal mucosa in the descending colon, in the                            transverse colon and in the ascending colon.                            Biopsied.                           - The examined portion of the ileum was normal. Recommendation:           - Patient has a contact number available for                            emergencies. The signs and  symptoms of potential                            delayed complications were discussed with the                             patient. Return to normal activities tomorrow.                            Written discharge instructions were provided to the                            patient.                           - Resume previous diet.                           - Continue present medications.                           - Await pathology results.                           - Repeat colonoscopy for surveillance.                           - Return to GI clinic at appointment to be                            scheduled.                           - Will follow-up pending stool studies. Gerrit Heck, MD 11/28/2018 8:32:51 AM

## 2018-11-28 NOTE — Progress Notes (Signed)
A/ox3, pleased with MAC, report to RN 

## 2018-11-28 NOTE — Telephone Encounter (Signed)
Stool studies still not resulted. Completed EGD/Colonoscopy today.

## 2018-11-28 NOTE — Progress Notes (Signed)
History reviewed today  VS per DC, temp per [place]

## 2018-11-28 NOTE — Op Note (Signed)
[facility] Patient Name: Sherry Butler Procedure Date: 11/28/2018 7:31 AM MRN: [id number] Endoscopist: Gerrit Heck , MD Age: 68 Referring MD:  Date of Birth: [birth year] Gender: Female Account #: [id number] Procedure:                Upper GI endoscopy Indications:              Epigastric abdominal pain, Heartburn, Suspected                            esophageal reflux, Hematochezia, Abdominal                            bloating, Diarrhea Medicines:                Monitored Anesthesia Care Procedure:                Pre-Anesthesia Assessment:                           - Prior to the procedure, a History and Physical                            was performed, and patient medications and                            allergies were reviewed. The patient's tolerance of                            previous anesthesia was also reviewed. The risks                            and benefits of the procedure and the sedation                            options and risks were discussed with the patient.                            All questions were answered, and informed consent                            was obtained. Prior Anticoagulants: The patient has                            taken no previous anticoagulant or antiplatelet                            agents. ASA Grade Assessment: II - A patient with                            mild systemic disease. After reviewing the risks                            and benefits, the patient was deemed in  satisfactory condition to undergo the procedure.                           After obtaining informed consent, the endoscope was                            passed under direct vision. Throughout the                            procedure, the patient's blood pressure, pulse, and                            oxygen saturations were monitored continuously. The                            Endoscope was introduced through the  mouth, and                            advanced to the second part of duodenum. The upper                            GI endoscopy was accomplished without difficulty.                            The patient tolerated the procedure well. Scope In: Scope Out: Findings:                 The examined esophagus was normal.                           The gastroesophageal flap valve was visualized                            endoscopically and classified as Hill Grade II                            (fold present, opens with respiration).                           Multiple 3 to 6 mm sessile polyps with no bleeding                            and no stigmata of recent bleeding were found in                            the gastric fundus and in the gastric body. Several                            of these polyps were removed with a cold biopsy                            forceps. Resection and retrieval were complete.  Estimated blood loss was minimal.                           The mucosa was otherwise normal appearing                            throughout the stomach. Biopsies were taken with a                            cold forceps for histology. Estimated blood loss                            was minimal.                           The duodenal bulb, first portion of the duodenum                            and second portion of the duodenum were normal.                            Biopsies for histology were taken with a cold                            forceps for evaluation of celiac disease. Estimated                            blood loss was minimal. Complications:            No immediate complications. Estimated Blood Loss:     Estimated blood loss was minimal. Impression:               - Normal esophagus.                           - Gastroesophageal flap valve classified as Hill                            Grade II (fold present, opens with respiration).                            - Multiple gastric polyps. Resected and retrieved.                           - Normal mucosa was found in the entire stomach.                            Biopsied.                           - Normal duodenal bulb, first portion of the                            duodenum and second portion of the duodenum.  Biopsied. Recommendation:           - Patient has a contact number available for                            emergencies. The signs and symptoms of potential                            delayed complications were discussed with the                            patient. Return to normal activities tomorrow.                            Written discharge instructions were provided to the                            patient.                           - Resume previous diet.                           - Continue present medications.                           - Await pathology results.                           - Return to GI clinic at appointment to be                            scheduled.                           - Will plan to reduce acid suppression therapy to                            the lowest effective dose for ongoing control of                            reflux symptoms. Gerrit Heck, MD 11/28/2018 8:24:09 AM

## 2018-11-28 NOTE — Progress Notes (Signed)
Called to room to assist during endoscopic procedure.  Patient ID and intended procedure confirmed with present staff. Received instructions for my participation in the procedure from the performing physician.  

## 2018-12-01 ENCOUNTER — Telehealth: Payer: Self-pay | Admitting: Family Medicine

## 2018-12-01 NOTE — Telephone Encounter (Signed)
Please call and let her know that I have placed referral to allergist for her.  Ok to [facility] with this info

## 2018-12-01 NOTE — Telephone Encounter (Signed)
Please advise 

## 2018-12-01 NOTE — Telephone Encounter (Signed)
Referral Request - Has patient seen PCP for this complaint? Yes.   *If NO, is insurance requiring patient see PCP for this issue before PCP can refer them? Referral for which specialty: Allergy testing Preferred provider/office: any provider  Reason for referral: Patient stated she is still having stomach issues. Has had other test done and they all came back negative. Wanted allergy testing to see what type of foods she may be allergic to.

## 2018-12-01 NOTE — Telephone Encounter (Signed)
Notified pt and she has already been contacted by allergist.

## 2018-12-02 ENCOUNTER — Telehealth: Payer: Self-pay

## 2018-12-02 NOTE — Telephone Encounter (Signed)
  Follow up Call-  Call back number 11/28/2018  Post procedure Call Back phone  # [phone]  Permission to leave phone message Yes  Some recent data might be hidden     Patient questions:  Do you have a fever, pain , or abdominal swelling? No. Pain Score  0 *  Have you tolerated food without any problems? Yes.    Have you been able to return to your normal activities? Yes.    Do you have any questions about your discharge instructions: Diet   No. Medications  No. Follow up visit  No.  Do you have questions or concerns about your Care? No.  Actions: * If pain score is 4 or above: No action needed, pain <4.  1. Have you developed a fever since your procedure? no  2.   Have you had an respiratory symptoms (SOB or cough) since your procedure? no  3.   Have you tested positive for COVID 19 since your procedure no  4.   Have you had any family members/close contacts diagnosed with the COVID 19 since your procedure?  no   If yes to any of these questions please route to Joylene John, RN and Alphonsa Gin, Therapist, sports.

## 2018-12-04 ENCOUNTER — Other Ambulatory Visit: Payer: Self-pay

## 2018-12-04 ENCOUNTER — Encounter: Payer: Self-pay | Admitting: Allergy and Immunology

## 2018-12-04 ENCOUNTER — Ambulatory Visit (INDEPENDENT_AMBULATORY_CARE_PROVIDER_SITE_OTHER): Payer: PPO | Admitting: Allergy and Immunology

## 2018-12-04 VITALS — BP 116/60 | HR 60 | Temp 97.9°F | Resp 12 | Ht 69.49 in | Wt 177.2 lb

## 2018-12-04 DIAGNOSIS — T7840XA Allergy, unspecified, initial encounter: Secondary | ICD-10-CM | POA: Insufficient documentation

## 2018-12-04 DIAGNOSIS — T7840XD Allergy, unspecified, subsequent encounter: Secondary | ICD-10-CM | POA: Diagnosis not present

## 2018-12-04 DIAGNOSIS — R232 Flushing: Secondary | ICD-10-CM

## 2018-12-04 DIAGNOSIS — L500 Allergic urticaria: Secondary | ICD-10-CM | POA: Insufficient documentation

## 2018-12-04 DIAGNOSIS — Z91018 Allergy to other foods: Secondary | ICD-10-CM | POA: Insufficient documentation

## 2018-12-04 DIAGNOSIS — K219 Gastro-esophageal reflux disease without esophagitis: Secondary | ICD-10-CM | POA: Diagnosis not present

## 2018-12-04 MED ORDER — LEVOCETIRIZINE DIHYDROCHLORIDE 5 MG PO TABS: 5.0000 mg | ORAL_TABLET | Freq: Every evening | ORAL | 5 refills | Status: AC

## 2018-12-04 NOTE — Assessment & Plan Note (Signed)
Given the history of flushing as well as episodes of generalized pruritus, we will check labs to rule out a mast cell disorder.  Laboratory order form has been provided for baseline serum tryptase level.  When lab results have returned the patient will be called with recommendations.

## 2018-12-04 NOTE — Assessment & Plan Note (Deleted)
Given the history of flushing as well as episodes of generalized pruritus, we will check labs to rule out a mast cell disorder.  Laboratory order form has been provided for baseline serum tryptase level.  When lab results have returned the patient will be called with recommendations.

## 2018-12-04 NOTE — Progress Notes (Signed)
New Patient Note  RE: Sherry Butler MRN: [id number] DOB: 26-Jan-1951 Date of Office Visit: 12/04/2018  Referring provider: Darreld Mclean, MD Primary care provider: Darreld Mclean, MD  Chief Complaint: Food Intolerance   History of present illness: Sherry Butler is a 68 y.o. female seen today in consultation requested by Lamar Blinks, MD.  She states that since March 2020, "Everything I eat makes me sick."  Specifically, she complains of abdominal bloating, "real bad heartburn", belching, and fluctuation.  She reports that the symptoms are worse at nighttime and that she will "burp all night."  Her symptoms are specifically aggravated by chocolate, citrus juice, dairy products, and breads.  In an attempt to reduce the symptoms, she has been mainly eating oatmeal, grits, baked chicken, and other bland foods.  She had an EGD and colonoscopy this past Friday, however has not received the results back yet.  She had been prescribed a proton pump inhibitor but did not start this medication and was told to hold off on starting untill the results from the upper and lower GI studies had been evaluated. She also complains of generalized pruritus when she is hot and sweaty.  She states, "I am allergic to my sweat."  She does not develop hives, angioedema, or cardiopulmonary symptoms.  In addition, since menopause she has experienced random episodes of flushing of her upper extremities in which she states that her arms become "beet red."  This flushing is transient and is not associated with any other symptoms.  Assessment and plan: Food intolerance/GI symptoms Skin tests to select food allergens were unrevealing today. The negative predictive value of food allergen skin testing is excellent (approximately 95%). While this does not appear to be an IgE mediated issue, skin testing does not rule out food intolerances, GERD, or cell-mediated enteropathies which may lend to GI symptoms. These  etiologies are suggested when elimination of the responsible food leads to symptom resolution and re-introduction of the food is followed by the return of symptoms.   The patient has been encouraged to keep a careful symptom/food journal and eliminate any food suspected of correlating with symptoms. Should symptoms concerning for anaphylaxis arise, 911 is to be called immediately.  I suspect that a proton pump inhibitor would be helpful, however we will await the results from the recent upper and lower GI studies and recommendations by the gastroenterologist.  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  A prescription has been provided for levocetirizine, 5 mg prior to events which would cause sweating.   Flushing Given the history of flushing as well as episodes of generalized pruritus, we will check labs to rule out a mast cell disorder.  Laboratory order form has been provided for baseline serum tryptase level.  When lab results have returned the patient will be called with recommendations.   Meds ordered this encounter  Medications   levocetirizine (XYZAL) 5 MG tablet    Sig: Take 1 tablet (5 mg total) by mouth every evening.    Dispense:  30 tablet    Refill:  5    Diagnostics: Environmental skin testing: Negative despite a positive histamine control. Food allergen skin testing: Negative despite a positive histamine control.    Physical examination: Blood pressure 116/60, pulse 60, temperature 97.9 F (36.6 C), temperature source Oral, resp. rate 12, height 5' 9.49" (1.765 m), weight 177 lb 4 oz (80.4 kg), SpO2 97 %.  General: Alert, interactive, in no acute distress. Neck: Supple without lymphadenopathy. Lungs: Clear to  auscultation without wheezing, rhonchi or rales. CV: Normal S1, S2 without murmurs. Abdomen: Nondistended, nontender. Normoactive bowel sounds. Skin: Warm and dry, without lesions or rashes. Extremities:  No clubbing, cyanosis or edema. Neuro:   Grossly  intact.  Review of systems:  Review of systems negative except as noted in HPI / PMHx or noted below: Review of Systems  Constitutional: Negative.   HENT: Negative.   Eyes: Negative.   Respiratory: Negative.   Cardiovascular: Negative.   Gastrointestinal: Negative.   Genitourinary: Negative.   Musculoskeletal: Negative.   Skin: Negative.   Neurological: Negative.   Endo/Heme/Allergies: Negative.   Psychiatric/Behavioral: Negative.     Past medical history:  Past Medical History:  Diagnosis Date   Anxiety    Brain aneurysm    Cancer ([place]) 05/2018   skin cancer on nose   Cataract 2016   bilat cataract extraction with IOL implant   Depression    GERD (gastroesophageal reflux disease)    High cholesterol    Hypertension    Kidney calculi    Kidney stone    Sleep apnea with use of continuous positive airway pressure (CPAP) 2015   Vertigo     Past surgical history:  Past Surgical History:  Procedure Laterality Date   ABDOMINAL HYSTERECTOMY     COLONOSCOPY     approximately 9 years ago. Sherry Butler they told her 10 years. In [place].   EYE SURGERY     IR 3D INDEPENDENT WKST  11/22/2017   IR ANGIO INTRA EXTRACRAN SEL COM CAROTID INNOMINATE UNI R MOD SED  11/22/2017   IR ANGIO INTRA EXTRACRAN SEL INTERNAL CAROTID BILAT MOD SED  11/22/2017   IR ANGIO VERTEBRAL SEL VERTEBRAL BILAT MOD SED  11/22/2017   TUBAL LIGATION      Family history: Family History  Problem Relation Age of Onset   Migraines Mother    Migraines Sister    Lupus Daughter    Colon cancer Neg Hx    Esophageal cancer Neg Hx    Rectal cancer Neg Hx    Stomach cancer Neg Hx     Social history: Social History   Socioeconomic History   Marital status: Married    Spouse name: Not on file   Number of children: Not on file   Years of education: Not on file   Highest education level: Not on file  Occupational History   Not on file  Social Needs   Financial resource strain:  Not on file   Food insecurity    Worry: Not on file    Inability: Not on file   Transportation needs    Medical: Not on file    Non-medical: Not on file  Tobacco Use   Smoking status: Former Smoker    Packs/day: 0.25    Years: 0.00    Pack years: 0.00    Types: Cigarettes    Quit date: 12/03/1988    Years since quitting: 30.0   Smokeless tobacco: Never Used   Tobacco comment: quit over 35 years ago. Light smoker  Substance and Sexual Activity   Alcohol use: No   Drug use: Never   Sexual activity: Not on file  Lifestyle   Physical activity    Days per week: Not on file    Minutes per session: Not on file   Stress: Not on file  Relationships   Social connections    Talks on phone: Not on file    Gets together: Not on file  Attends religious service: Not on file    Active member of club or organization: Not on file    Attends meetings of clubs or organizations: Not on file    Relationship status: Not on file   Intimate partner violence    Fear of current or ex partner: Not on file    Emotionally abused: Not on file    Physically abused: Not on file    Forced sexual activity: Not on file  Other Topics Concern   Not on file  Social History Narrative   Not on file   Environmental History: The patient lives in a 68 year old house with carpeting throughout and gas heat, and central air.  There is mold/water damage in the home.  There is a cat and dog in the home which have access to her bedroom.  She is a former cigarette smoker having quit 15 years ago.  Allergies as of 12/04/2018      Reactions   Propoxyphene Nausea And Vomiting, Other (See Comments)   Propoxyphene and Acetaminophen   Demerol  [meperidine Hcl] Nausea And Vomiting   Valium [diazepam] Other (See Comments)   Unknown      Medication List       Accurate as of December 04, 2018 10:27 AM. If you have any questions, ask your nurse or doctor.        STOP taking these medications     chlorthalidone 25 MG tablet Commonly known as: HYGROTON Stopped by: Edmonia Lynch, MD   cholecalciferol 1000 units tablet Commonly known as: VITAMIN D Stopped by: Edmonia Lynch, MD   cyclobenzaprine 5 MG tablet Commonly known as: FLEXERIL Stopped by: Edmonia Lynch, MD   omeprazole 40 MG capsule Commonly known as: PRILOSEC Stopped by: Edmonia Lynch, MD   sucralfate 1 g tablet Commonly known as: CARAFATE Stopped by: Edmonia Lynch, MD   WOMENS MULTIVITAMIN Butler Stopped by: Edmonia Lynch, MD     TAKE these medications   COD LIVER OIL Butler Take 1 capsule by mouth daily.   GARLIC Butler Take 1 capsule by mouth daily.   hydrOXYzine 25 MG tablet Commonly known as: ATARAX/VISTARIL Take 25 mg by mouth. Pt. Takes 1/2 tablet at night. What changed: Another medication with the same name was removed. Continue taking this medication, and follow the directions you see here. Changed by: Edmonia Lynch, MD   levocetirizine 5 MG tablet Commonly known as: XYZAL Take 1 tablet (5 mg total) by mouth every evening. Started by: Edmonia Lynch, MD   lisinopril 20 MG tablet Commonly known as: ZESTRIL Take 1 tablet (20 mg total) by mouth daily.   VITAMIN B-12 Butler Take 1 tablet by mouth daily.       Known medication allergies: Allergies  Allergen Reactions   Propoxyphene Nausea And Vomiting and Other (See Comments)    Propoxyphene and Acetaminophen   Demerol  [Meperidine Hcl] Nausea And Vomiting   Valium [Diazepam] Other (See Comments)    Unknown    I appreciate the opportunity to take part in Sherry Butler's care. Please do not hesitate to contact me with questions.  Sincerely,   R. Edgar Frisk, MD

## 2018-12-04 NOTE — Patient Instructions (Addendum)
Food intolerance/GI symptoms Skin tests to select food allergens were unrevealing today. The negative predictive value of food allergen skin testing is excellent (approximately 95%). While this does not appear to be an IgE mediated issue, skin testing does not rule out food intolerances, GERD, or cell-mediated enteropathies which may lend to GI symptoms. These etiologies are suggested when elimination of the responsible food leads to symptom resolution and re-introduction of the food is followed by the return of symptoms.   The patient has been encouraged to keep a careful symptom/food journal and eliminate any food suspected of correlating with symptoms. Should symptoms concerning for anaphylaxis arise, 911 is to be called immediately.  I suspect that a proton pump inhibitor would be helpful, however we will await the results from the recent upper and lower GI studies and recommendations by the gastroenterologist.  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  A prescription has been provided for levocetirizine, 5 mg prior to events which would cause sweating.   Flushing Given the history of flushing as well as episodes of generalized pruritus, we will check labs to rule out a mast cell disorder.  Laboratory order form has been provided for baseline serum tryptase level.  When lab results have returned the patient will be called with recommendations.

## 2018-12-04 NOTE — Assessment & Plan Note (Addendum)
Skin tests to select food allergens were unrevealing today. The negative predictive value of food allergen skin testing is excellent (approximately 95%). While this does not appear to be an IgE mediated issue, skin testing does not rule out food intolerances, GERD, or cell-mediated enteropathies which may lend to GI symptoms. These etiologies are suggested when elimination of the responsible food leads to symptom resolution and re-introduction of the food is followed by the return of symptoms.   The patient has been encouraged to keep a careful symptom/food journal and eliminate any food suspected of correlating with symptoms. Should symptoms concerning for anaphylaxis arise, 911 is to be called immediately.  I suspect that a proton pump inhibitor would be helpful, however we will await the results from the recent upper and lower GI studies and recommendations by the gastroenterologist.

## 2018-12-04 NOTE — Assessment & Plan Note (Signed)
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  A prescription has been provided for levocetirizine, 5 mg prior to events which would cause sweating.

## 2018-12-05 ENCOUNTER — Encounter: Payer: Self-pay | Admitting: Gastroenterology

## 2018-12-05 DIAGNOSIS — R232 Flushing: Secondary | ICD-10-CM | POA: Diagnosis not present

## 2018-12-05 DIAGNOSIS — T7840XD Allergy, unspecified, subsequent encounter: Secondary | ICD-10-CM | POA: Diagnosis not present

## 2018-12-08 ENCOUNTER — Telehealth: Payer: Self-pay

## 2018-12-08 DIAGNOSIS — K9049 Malabsorption due to intolerance, not elsewhere classified: Secondary | ICD-10-CM

## 2018-12-08 DIAGNOSIS — K909 Intestinal malabsorption, unspecified: Secondary | ICD-10-CM

## 2018-12-08 LAB — GASTROINTESTINAL PATHOGEN PANEL PCR
C. difficile Tox A/B, PCR: NOT DETECTED
Campylobacter, PCR: NOT DETECTED
Cryptosporidium, PCR: NOT DETECTED
E coli (ETEC) LT/ST PCR: NOT DETECTED
E coli (STEC) stx1/stx2, PCR: NOT DETECTED
E coli 0157, PCR: NOT DETECTED
Giardia lamblia, PCR: NOT DETECTED
Norovirus, PCR: NOT DETECTED
Rotavirus A, PCR: NOT DETECTED
Salmonella, PCR: NOT DETECTED
Shigella, PCR: NOT DETECTED

## 2018-12-08 LAB — TRYPTASE: Tryptase: 5.3 ug/L (ref 2.2–13.2)

## 2018-12-08 LAB — CALPROTECTIN: Calprotectin: 556 mcg/g — ABNORMAL HIGH

## 2018-12-08 NOTE — Addendum Note (Signed)
Addended by: Kelle Darting A on: 12/08/2018 02:20 PM   Modules accepted: Orders

## 2018-12-08 NOTE — Addendum Note (Signed)
Addended by: Lamar Blinks C on: 12/08/2018 04:35 PM   Modules accepted: Orders

## 2018-12-08 NOTE — Telephone Encounter (Signed)
Copied from [place] [id number]. Topic: General - Inquiry >> Dec 08, 2018  8:00 AM Lennox Solders wrote: Reason for CRM:pt would like to see if dr copland could give her a blood test to see if she is allergic to dairy products. Pt does not want to see md

## 2018-12-08 NOTE — Telephone Encounter (Signed)
Gilmore Laroche - Do you know which food allergy profile includes dairy?  I am not sure Thank you!

## 2018-12-08 NOTE — Telephone Encounter (Signed)
I have pended the test that [facility] gave me. It is Casein (f78) IgE. Dairy, cheese and milk protein. [facility] test code 2853, [place] code, Lab [phone].

## 2018-12-09 ENCOUNTER — Other Ambulatory Visit: Payer: Self-pay

## 2018-12-09 ENCOUNTER — Ambulatory Visit (INDEPENDENT_AMBULATORY_CARE_PROVIDER_SITE_OTHER): Payer: PPO | Admitting: Family Medicine

## 2018-12-09 ENCOUNTER — Encounter: Payer: Self-pay | Admitting: Family Medicine

## 2018-12-09 VITALS — BP 118/80 | HR 66 | Temp 98.2°F | Ht 70.0 in | Wt 176.0 lb

## 2018-12-09 DIAGNOSIS — R1013 Epigastric pain: Secondary | ICD-10-CM

## 2018-12-09 DIAGNOSIS — R140 Abdominal distension (gaseous): Secondary | ICD-10-CM | POA: Diagnosis not present

## 2018-12-09 MED ORDER — AMITRIPTYLINE HCL 25 MG PO TABS: 25.0000 mg | ORAL_TABLET | Freq: Every day | ORAL | 1 refills | Status: AC

## 2018-12-09 NOTE — Telephone Encounter (Signed)
Patient has been scheduled to come in for lab draw tomorrow am

## 2018-12-09 NOTE — Patient Instructions (Signed)
Someone will reach out regarding your [place].  Take the new medicine at night.  This can help with both abdominal discomfort and anxiety.  Coping skills Choose 5 that work for you:  Take a deep breath  Count to 20  Read a book  Do a puzzle  Meditate  Bake  Sing  Knit  Garden  Pray  Go outside  Call a friend  Listen to music  Take a walk  Color  Send a note  Take a bath  Watch a movie  Be alone in a quiet place  Pet an animal  Visit a friend  Journal  Exercise  Stretch   Let us know if you need anything.

## 2018-12-09 NOTE — Progress Notes (Signed)
Chief Complaint  Patient presents with  . Bloated    Subjective: Patient is a 68 y.o. female here for chronic abd issues.  Has been dealing with post prandial abd bloating and discomfort since March of this year.  She has seen gastroenterology and had upper and lower endoscopy on 7/10, waiting on the results.  She has been having high levels of anxiety and panic attacks due to her concern over the episode she has been having.  She is certain that her levels of stress/anxiety had not be increasing leading into these episodes.  This is been a longstanding issue without any known triggers.  She denies having any imaging during 1 of these "attacks".  She is quite hungry but has not been able to eat a substantial amount of food.  She has lost several pounds unintentionally.  She is not having any nausea or vomiting, intermittent bowel changes.  It will wake her up at night.  She is failed PPI, no inflammation was found on upper endoscopy.  She has never had a right upper quadrant ultrasound.  She has heard of IBS, however has never been diagnosed.  ROS: GU: No urinary complaints GI: As noted in HPI  Past Medical History:  Diagnosis Date  . Anxiety   . Brain aneurysm   . Cancer ([place]) 05/2018   skin cancer on nose  . Cataract 2016   bilat cataract extraction with IOL implant  . Depression   . GERD (gastroesophageal reflux disease)   . High cholesterol   . Hypertension   . Kidney calculi   . Kidney stone   . Sleep apnea with use of continuous positive airway pressure (CPAP) 2015  . Vertigo     Objective: BP 118/80 (BP Location: Left Arm, Patient Position: Sitting, Cuff Size: Normal)   Pulse 66   Temp 98.2 F (36.8 C) (Oral)   Ht 5\' 10"  (1.778 m)   Wt 176 lb (79.8 kg)   SpO2 95%   BMI 25.25 kg/m  General: Awake, appears stated age HEENT: MMM, EOMi Heart: RRR, no murmurs Lungs: CTAB, no rales, wheezes or rhonchi. No accessory muscle use Abd: BS+, S, diffusely tender to palpation,  worse in the epigastric region, ND, no masses or organomegaly, negative Murphy sign Psych: Age appropriate judgment and insight, normal affect and mood  Assessment and Plan: Epigastric pain - Plan: US Abdomen Limited RUQ, amitriptyline (ELAVIL) 25 MG tablet; told patient I do not think we will find a solid answer today.  Will await GI recommendations.  Will likely order CT abdomen/pelvis if no further GI recommendations.  Today we will start TCA to cover for both anxiety and IBS.  Counseled on side effects.  Abdominal bloating - Plan: US Abdomen Limited RUQ, amitriptyline (ELAVIL) 25 MG tablet  Follow-up pending the results of her GI studies. The patient voiced understanding and agreement to the plan.  [place], DO 12/09/18  2:41 PM

## 2018-12-10 ENCOUNTER — Other Ambulatory Visit (INDEPENDENT_AMBULATORY_CARE_PROVIDER_SITE_OTHER): Payer: PPO

## 2018-12-10 DIAGNOSIS — K909 Intestinal malabsorption, unspecified: Secondary | ICD-10-CM

## 2018-12-10 DIAGNOSIS — K9049 Malabsorption due to intolerance, not elsewhere classified: Secondary | ICD-10-CM

## 2018-12-11 ENCOUNTER — Ambulatory Visit (HOSPITAL_BASED_OUTPATIENT_CLINIC_OR_DEPARTMENT_OTHER)
Admission: RE | Admit: 2018-12-11 | Discharge: 2018-12-11 | Disposition: A | Discharge status: Home / Self Care | Payer: PPO | Source: Ambulatory Visit | Attending: Family Medicine | Admitting: Family Medicine

## 2018-12-11 ENCOUNTER — Other Ambulatory Visit: Payer: Self-pay

## 2018-12-11 DIAGNOSIS — R1013 Epigastric pain: Secondary | ICD-10-CM

## 2018-12-11 DIAGNOSIS — R140 Abdominal distension (gaseous): Secondary | ICD-10-CM | POA: Insufficient documentation

## 2018-12-11 LAB — ALLERGEN, CASEIN, F78
Allergen, Casein, f78: <0.1 kU/L
CLASS: 0

## 2018-12-11 LAB — INTERPRETATION:

## 2018-12-15 ENCOUNTER — Encounter: Payer: Self-pay | Admitting: Family Medicine

## 2018-12-18 DIAGNOSIS — I671 Cerebral aneurysm, nonruptured: Secondary | ICD-10-CM | POA: Diagnosis not present

## 2018-12-19 ENCOUNTER — Telehealth: Payer: Self-pay

## 2018-12-19 NOTE — Telephone Encounter (Signed)
Copied from [place] [id number]. Topic: Quick Communication - Lab Results (Clinic Use ONLY) >> Dec 16, 2018 11:42 AM Lennox Solders wrote: Pt is calling and would like her  blood work result to see if she is allergic to dairy

## 2018-12-19 NOTE — Telephone Encounter (Signed)
Patient was given letter in mail.

## 2018-12-24 DIAGNOSIS — Z79899 Other long term (current) drug therapy: Secondary | ICD-10-CM | POA: Diagnosis not present

## 2018-12-24 DIAGNOSIS — K219 Gastro-esophageal reflux disease without esophagitis: Secondary | ICD-10-CM | POA: Diagnosis not present

## 2018-12-24 DIAGNOSIS — K589 Irritable bowel syndrome without diarrhea: Secondary | ICD-10-CM | POA: Diagnosis not present

## 2018-12-24 DIAGNOSIS — Z9889 Other specified postprocedural states: Secondary | ICD-10-CM | POA: Diagnosis not present

## 2018-12-24 DIAGNOSIS — Z85828 Personal history of other malignant neoplasm of skin: Secondary | ICD-10-CM | POA: Diagnosis not present

## 2018-12-24 DIAGNOSIS — Z008 Encounter for other general examination: Secondary | ICD-10-CM | POA: Diagnosis not present

## 2018-12-24 DIAGNOSIS — I10 Essential (primary) hypertension: Secondary | ICD-10-CM | POA: Diagnosis not present

## 2018-12-24 DIAGNOSIS — Z6825 Body mass index (BMI) 25.0-25.9, adult: Secondary | ICD-10-CM | POA: Diagnosis not present

## 2018-12-24 DIAGNOSIS — F419 Anxiety disorder, unspecified: Secondary | ICD-10-CM | POA: Diagnosis not present

## 2018-12-24 DIAGNOSIS — K648 Other hemorrhoids: Secondary | ICD-10-CM | POA: Diagnosis not present

## 2018-12-24 DIAGNOSIS — I671 Cerebral aneurysm, nonruptured: Secondary | ICD-10-CM | POA: Diagnosis not present

## 2018-12-24 DIAGNOSIS — Z90711 Acquired absence of uterus with remaining cervical stump: Secondary | ICD-10-CM | POA: Diagnosis not present

## 2018-12-24 DIAGNOSIS — Z0000 Encounter for general adult medical examination without abnormal findings: Secondary | ICD-10-CM | POA: Diagnosis not present

## 2019-01-14 DIAGNOSIS — Z7189 Other specified counseling: Secondary | ICD-10-CM | POA: Diagnosis not present

## 2019-01-14 DIAGNOSIS — Z87891 Personal history of nicotine dependence: Secondary | ICD-10-CM | POA: Diagnosis not present

## 2019-01-14 DIAGNOSIS — Z0001 Encounter for general adult medical examination with abnormal findings: Secondary | ICD-10-CM | POA: Diagnosis not present

## 2019-01-14 DIAGNOSIS — E781 Pure hyperglyceridemia: Secondary | ICD-10-CM | POA: Diagnosis not present

## 2019-01-14 DIAGNOSIS — Z23 Encounter for immunization: Secondary | ICD-10-CM | POA: Diagnosis not present

## 2019-01-14 DIAGNOSIS — K589 Irritable bowel syndrome without diarrhea: Secondary | ICD-10-CM | POA: Diagnosis not present

## 2019-01-14 DIAGNOSIS — E663 Overweight: Secondary | ICD-10-CM | POA: Diagnosis not present

## 2019-01-14 DIAGNOSIS — I671 Cerebral aneurysm, nonruptured: Secondary | ICD-10-CM | POA: Diagnosis not present

## 2019-01-14 DIAGNOSIS — Z780 Asymptomatic menopausal state: Secondary | ICD-10-CM | POA: Diagnosis not present

## 2019-01-14 DIAGNOSIS — I700 Atherosclerosis of aorta: Secondary | ICD-10-CM | POA: Diagnosis not present

## 2019-01-14 DIAGNOSIS — F419 Anxiety disorder, unspecified: Secondary | ICD-10-CM | POA: Diagnosis not present

## 2019-01-14 DIAGNOSIS — J8410 Pulmonary fibrosis, unspecified: Secondary | ICD-10-CM | POA: Diagnosis not present

## 2019-01-19 DIAGNOSIS — M810 Age-related osteoporosis without current pathological fracture: Secondary | ICD-10-CM | POA: Diagnosis not present

## 2019-01-19 DIAGNOSIS — Z780 Asymptomatic menopausal state: Secondary | ICD-10-CM | POA: Diagnosis not present

## 2019-01-19 DIAGNOSIS — M85852 Other specified disorders of bone density and structure, left thigh: Secondary | ICD-10-CM | POA: Diagnosis not present

## 2019-01-19 DIAGNOSIS — R911 Solitary pulmonary nodule: Secondary | ICD-10-CM | POA: Diagnosis not present

## 2019-01-19 DIAGNOSIS — J8410 Pulmonary fibrosis, unspecified: Secondary | ICD-10-CM | POA: Diagnosis not present

## 2019-05-28 ENCOUNTER — Telehealth: Payer: Self-pay | Admitting: Family Medicine

## 2019-05-28 NOTE — Telephone Encounter (Signed)
Called patient to find out more information on the form. I need to know her PAP pressure setting. Husband advised he will call me back when she is back home.

## 2019-05-28 NOTE — Telephone Encounter (Signed)
RJ, from [facility], called stating they sent a fax over on 05/26/19 for cpap supplies for the pt and is requesting a response. RJ states he will fax this back over. Please advise.    Fax # [phone]   Callback [phone]

## 2019-10-16 NOTE — Progress Notes (Deleted)
Pt did not show for her awv and did not answer when called. No voicemail option.

## 2019-10-20 ENCOUNTER — Ambulatory Visit: Payer: Medicare Other | Admitting: *Deleted

## 2019-12-30 IMAGING — CT CT CHEST W/O CM
2 of 3 series · 15 of 36 positions shown, 18 images · non-contrast
Comparison: CT scan dated 02/05/2017

CLINICAL DATA: Follow-up of right lower lobe pulmonary nodule.

EXAM:
CT CHEST WITHOUT CONTRAST
TECHNIQUE: Multidetector CT imaging of the chest was performed following the
standard protocol without IV contrast.

[Series 2: thorax · axial · 0.74mm/px · z∈[-294,-20]mm · 12 of 161 slices shown, 15 images]
[im 12/161  mediastinal]
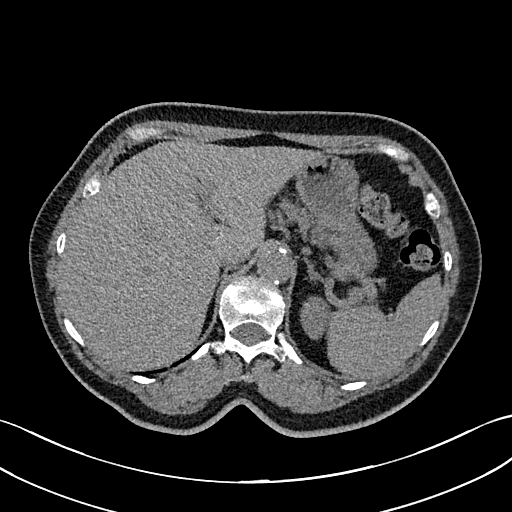
[im 12/161  lung]
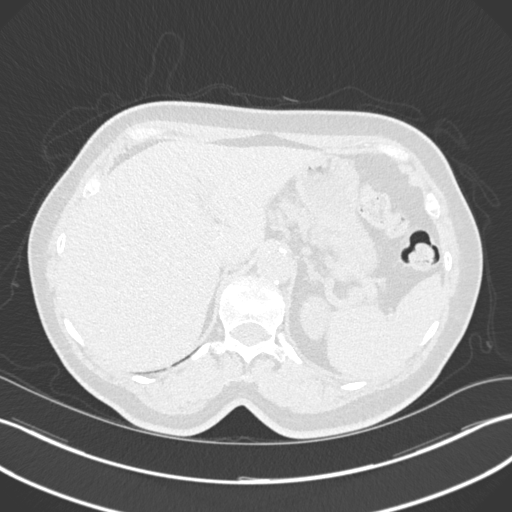
[im 24/161  lung]
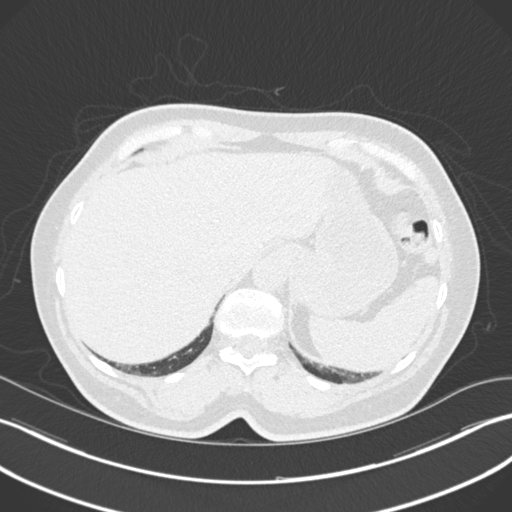
[im 36/161  lung]
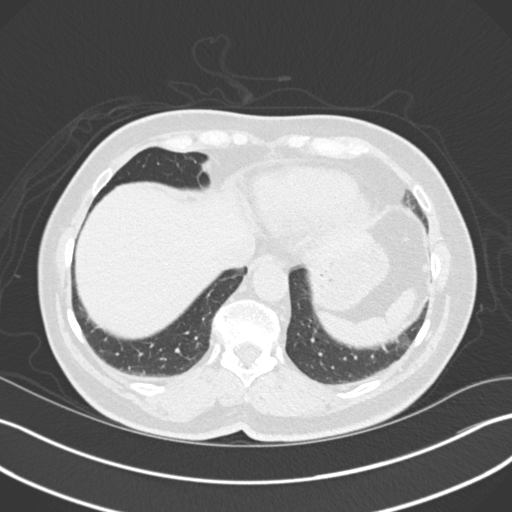
[im 48/161  lung]
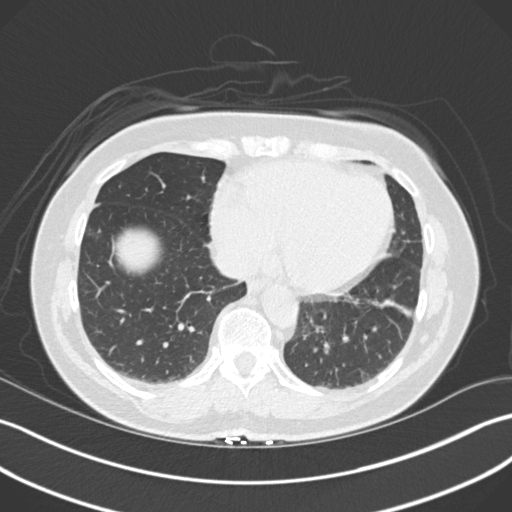
[im 60/161  mediastinal]
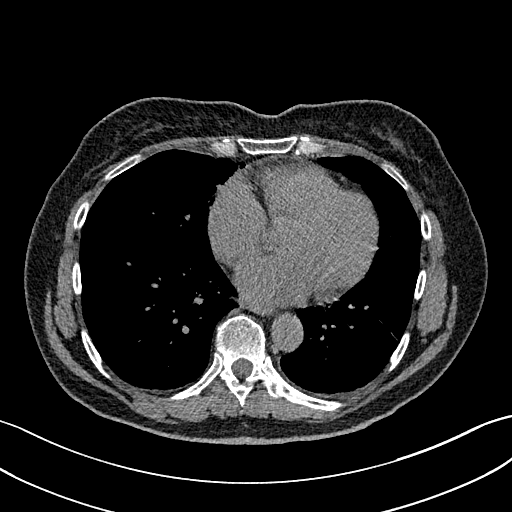
[im 60/161  lung]
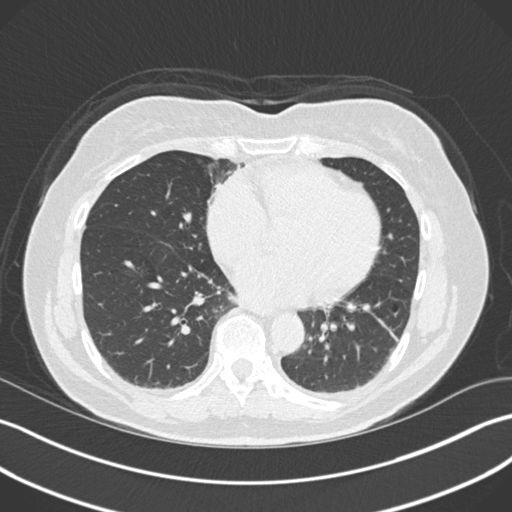
[im 72/161  lung]
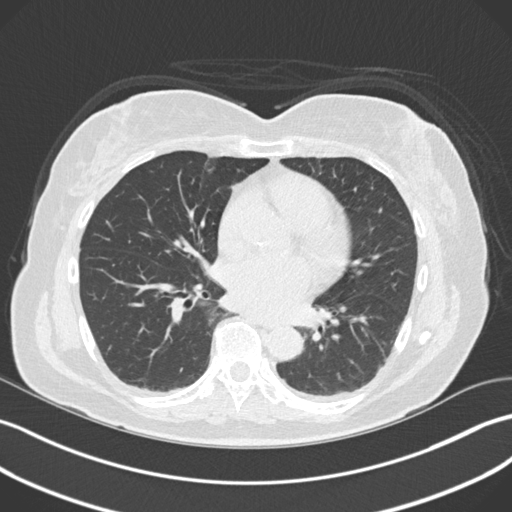
[im 89/161  lung]
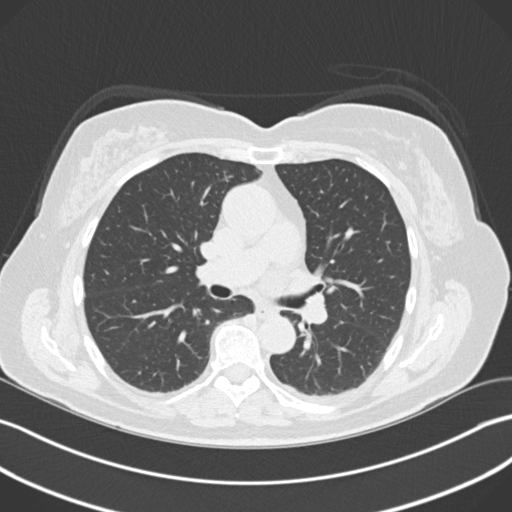
[im 101/161  lung]
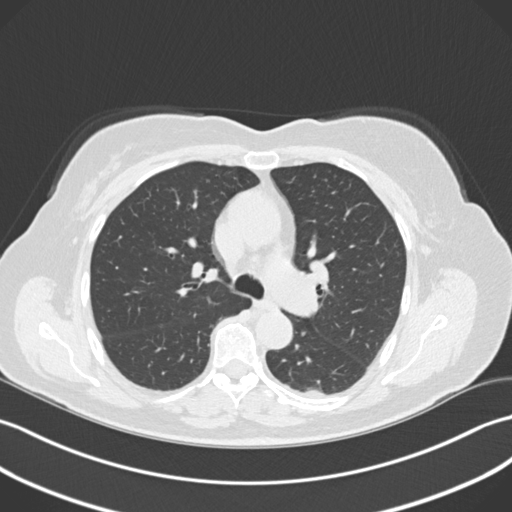
[im 113/161  mediastinal]
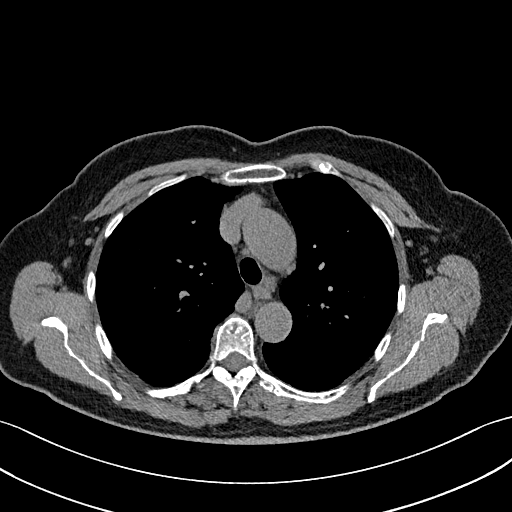
[im 113/161  lung]
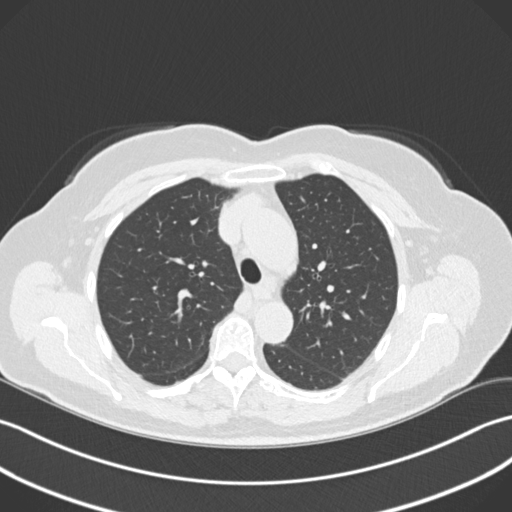
[im 125/161  lung]
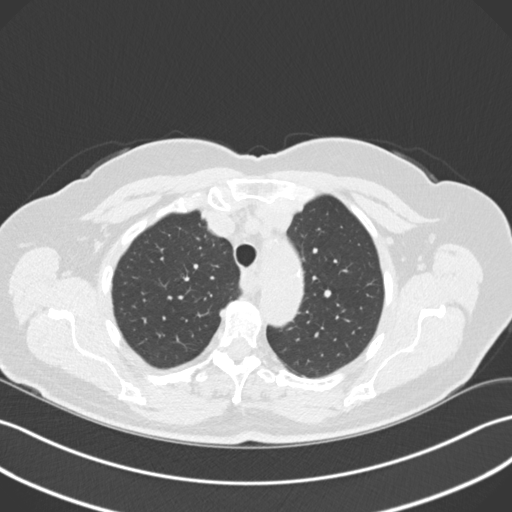
[im 137/161  lung]
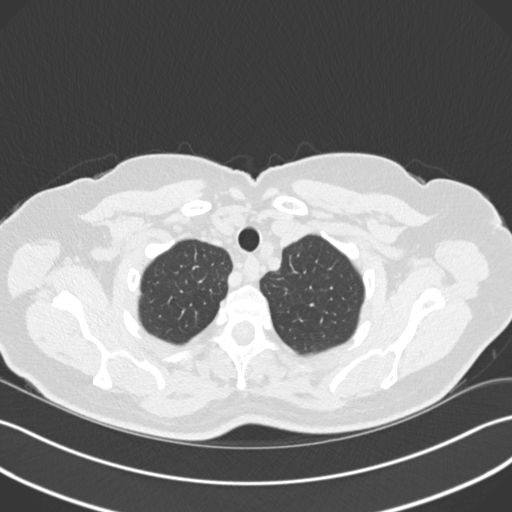
[im 149/161  lung]
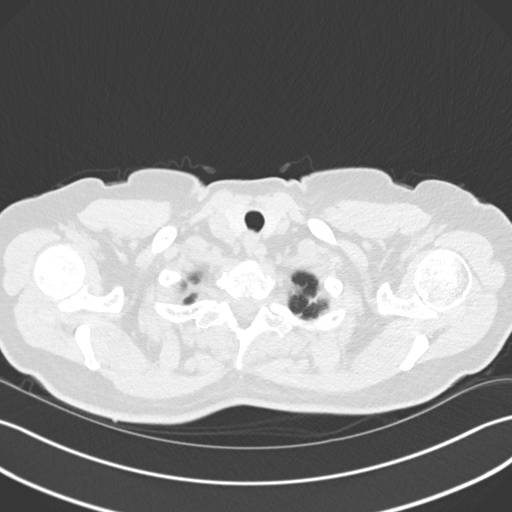

[Series 5: coronal · coronal · 0.65mm/px · 3 of 131 slices shown]
[im 27/131  lung]
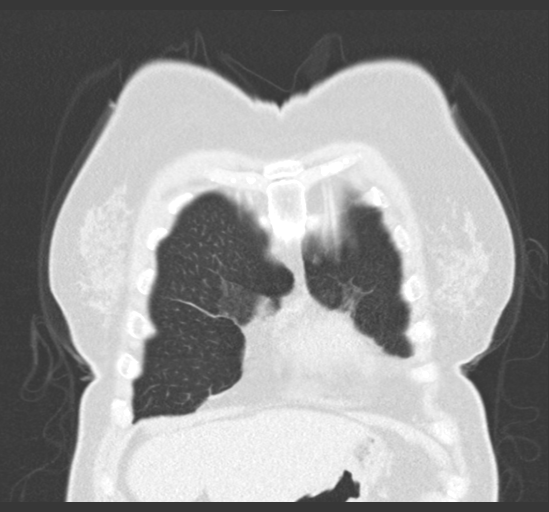
[im 53/131  lung]
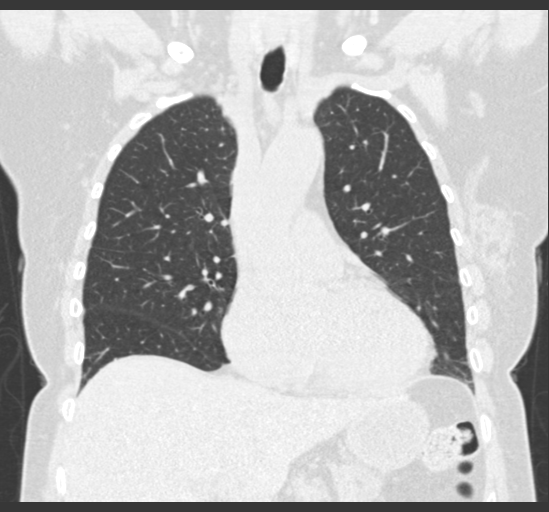
[im 79/131  lung]
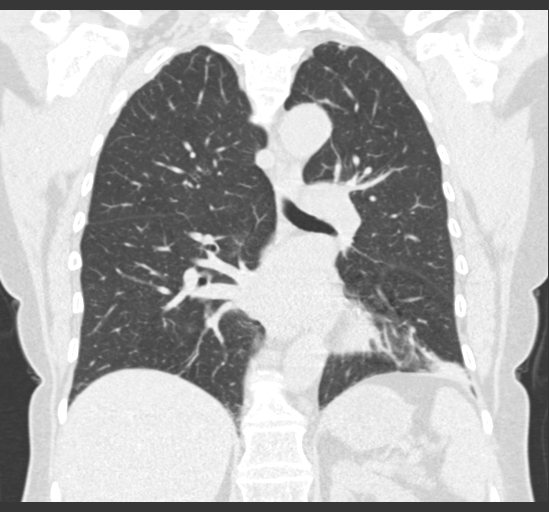

[15 of 36 positions shown; findings below may reference images not displayed]

FINDINGS: Cardiovascular: Aortic atherosclerosis. Slight prominence of the
ascending thoracic aorta to a diameter 3.6 cm. Overall heart size is
normal. No pericardial effusion.

Mediastinum/Nodes: 14 mm nodule in the left lobe of the thyroid
gland. No mediastinal or hilar adenopathy. Esophagus and trachea
appear normal.

Lungs/Pleura: There is a tiny irregular nodule in the right lower
lobe best seen on image 99 of series 3, now measuring 5 mm,,
essentially unchanged from 6 mm on the prior study.

There is minimal scarring at the lung apices. There is slight stable
scarring in the anteromedial aspect of the right middle lobe with
slight linear scarring at both lung bases. Two tiny calcifications
in the left lung base are consistent with granulomas. Lungs are
otherwise clear. No effusions.

Upper Abdomen: Negative.

Musculoskeletal: No chest wall mass or suspicious bone lesions
identified.
IMPRESSION: 1. No significant change in the small irregular nodule in the right
lower lobe now with a maximum diameter of 5 mm. One additional 12
month follow-up CT scan without contrast is recommended to determine
ongoing stability.
2. No other significant abnormalities.

## 2020-06-01 IMAGING — DX DG RIBS W/ CHEST 3+V*R*
3 series · 3 of 3 positions shown · non-contrast
Comparison: CT scan of the chest dated 01/29/2018 and chest x-ray
dated 07/21/2017

CLINICAL DATA: Chronic progressive right rib pain and right
scapular pain for 2 years.

EXAM:
RIGHT RIBS AND CHEST - 3+ VIEW

[chest pa]
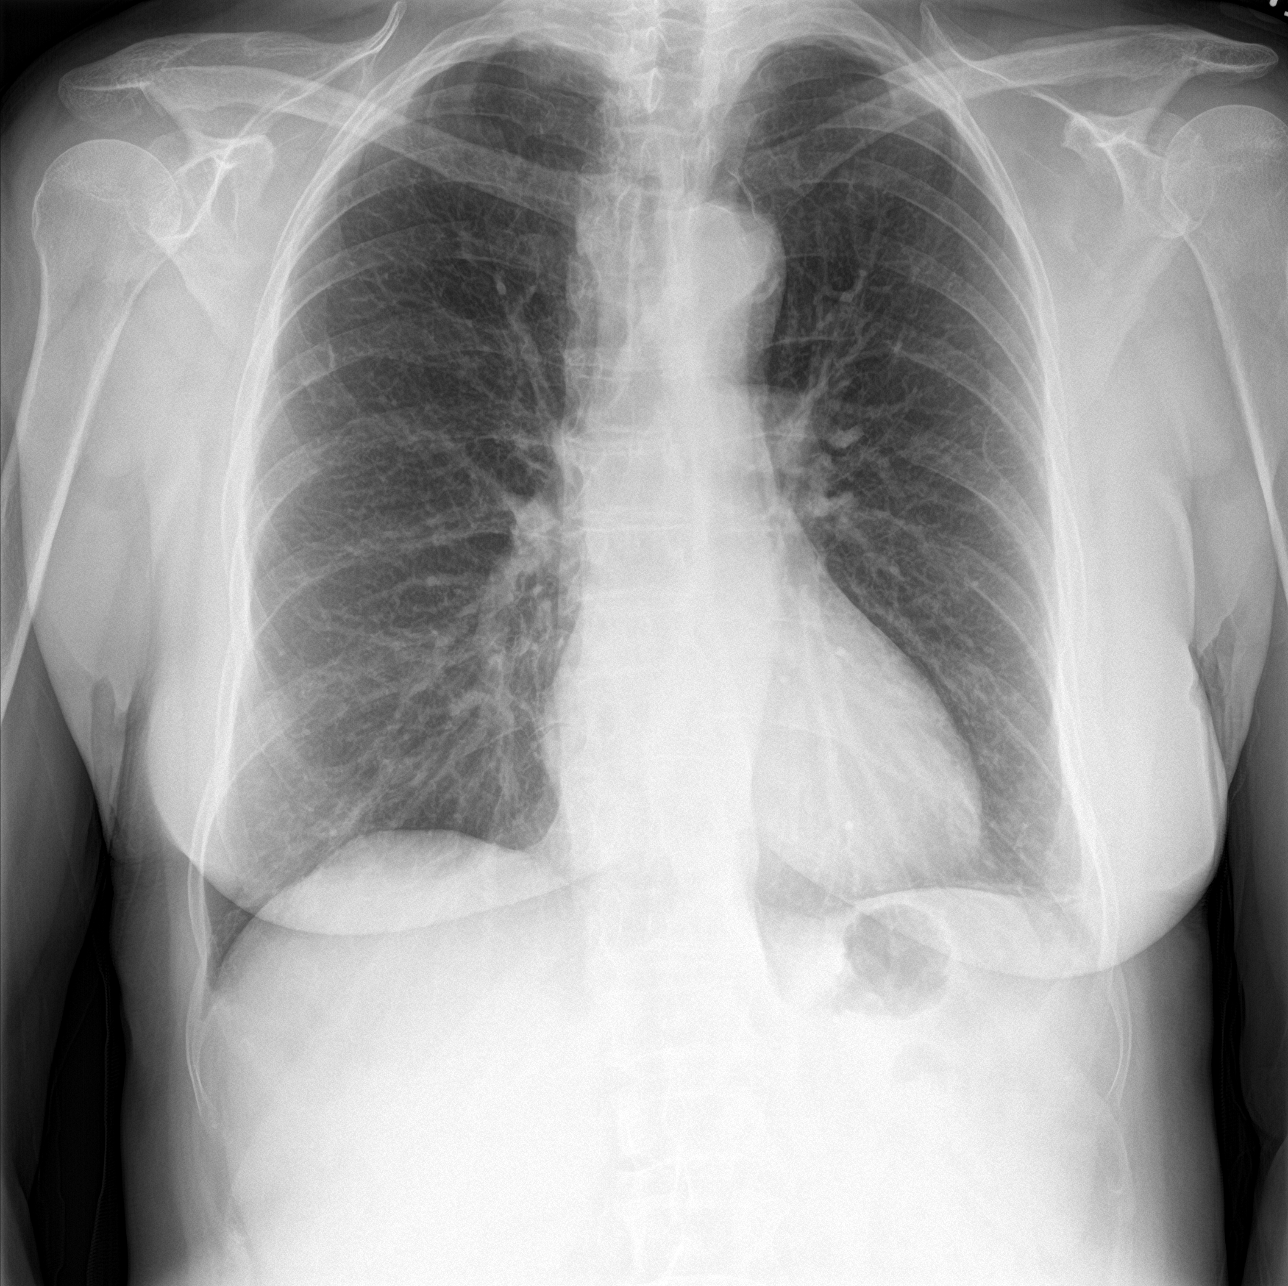

[rib ap]
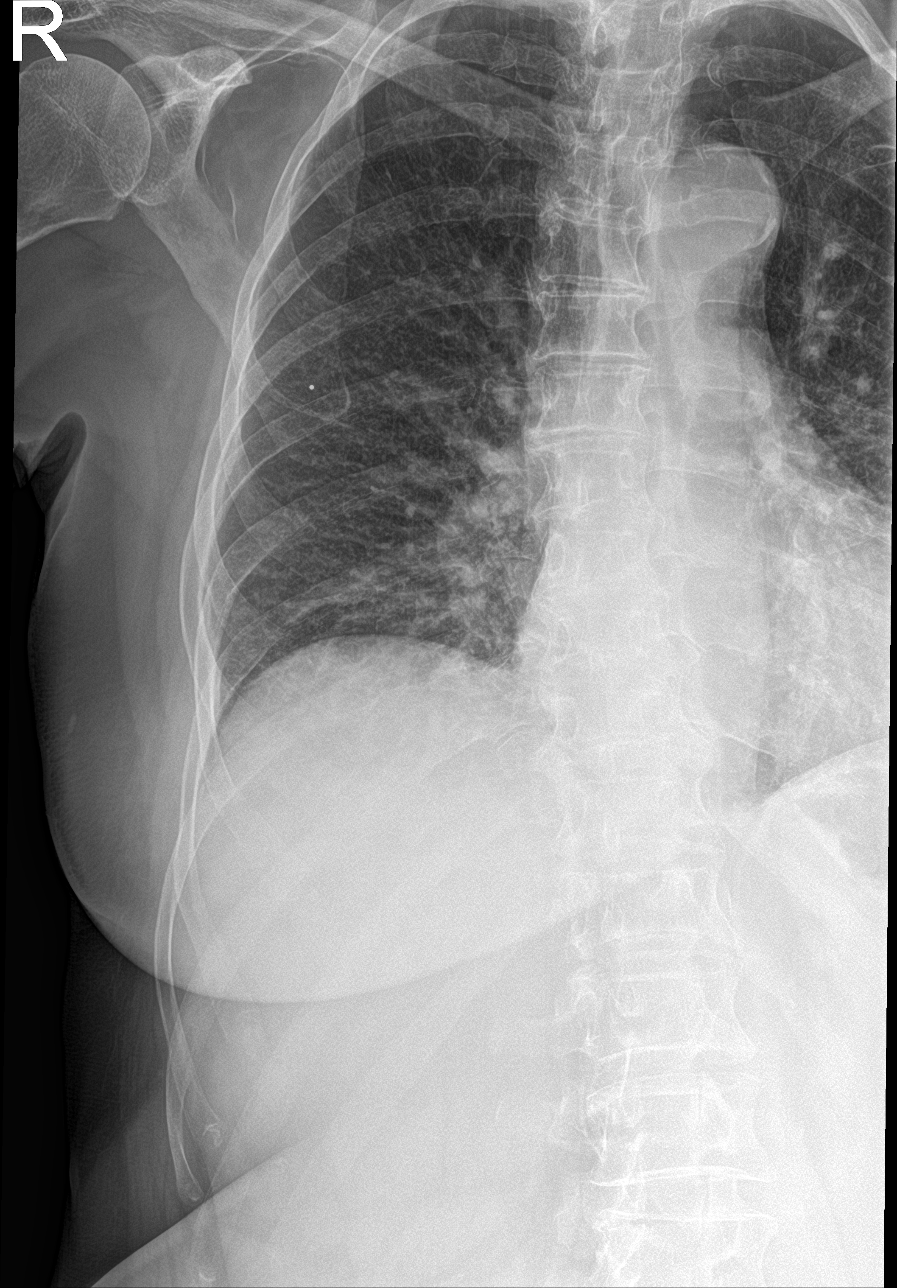

[rib ap obl]
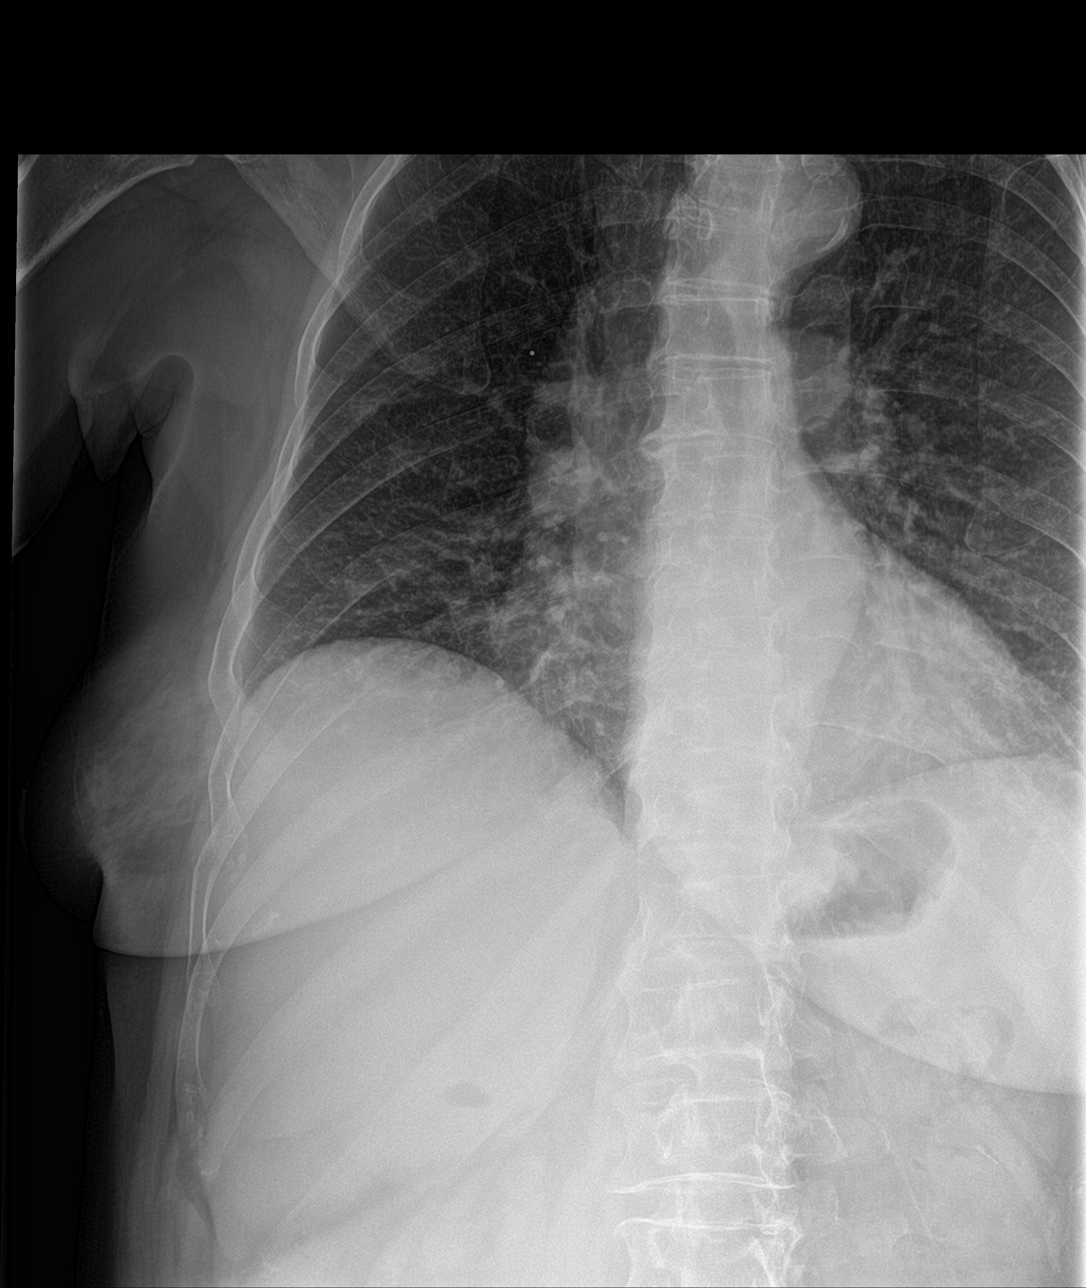

[3 of 3 positions shown; findings below may reference images not displayed]

FINDINGS: No fracture or other bone lesions are seen involving the ribs. There
is no evidence of pneumothorax or pleural effusion. Both lungs are
clear. Heart size and mediastinal contours are within normal limits.
Aortic atherosclerosis.
IMPRESSION: Normal right ribs.

Aortic Atherosclerosis ([id number]).
